# Patient Record
Sex: Male | Born: 1945
Health system: Southern US, Community
[De-identification: ages and names within clinical notes are randomized; demographics above are authoritative.]

## PROBLEM LIST (undated history)

## (undated) DIAGNOSIS — F329 Major depressive disorder, single episode, unspecified: Secondary | ICD-10-CM

## (undated) DIAGNOSIS — E7800 Pure hypercholesterolemia, unspecified: Secondary | ICD-10-CM

## (undated) DIAGNOSIS — F32A Depression, unspecified: Secondary | ICD-10-CM

## (undated) DIAGNOSIS — B019 Varicella without complication: Secondary | ICD-10-CM

## (undated) DIAGNOSIS — G4730 Sleep apnea, unspecified: Secondary | ICD-10-CM

## (undated) DIAGNOSIS — I10 Essential (primary) hypertension: Secondary | ICD-10-CM

## (undated) DIAGNOSIS — F419 Anxiety disorder, unspecified: Secondary | ICD-10-CM

## (undated) HISTORY — DX: Sleep apnea, unspecified: G47.30

## (undated) HISTORY — DX: Pure hypercholesterolemia, unspecified: E78.00

## (undated) HISTORY — DX: Depression, unspecified: F32.A

## (undated) HISTORY — DX: Varicella without complication: B01.9

## (undated) HISTORY — DX: Major depressive disorder, single episode, unspecified: F32.9

## (undated) HISTORY — DX: Anxiety disorder, unspecified: F41.9

## (undated) HISTORY — DX: Essential (primary) hypertension: I10

## (undated) HISTORY — PX: TONSILLECTOMY: SUR1361

## (undated) HISTORY — PX: HERNIA REPAIR: SHX51

---

## 2009-12-26 ENCOUNTER — Ambulatory Visit (HOSPITAL_COMMUNITY): Admission: RE | Admit: 2009-12-26 | Discharge: 2009-12-26 | Payer: Self-pay | Admitting: Urology

## 2012-11-24 DIAGNOSIS — N209 Urinary calculus, unspecified: Secondary | ICD-10-CM | POA: Diagnosis not present

## 2012-11-24 DIAGNOSIS — N200 Calculus of kidney: Secondary | ICD-10-CM | POA: Diagnosis not present

## 2012-11-24 DIAGNOSIS — I10 Essential (primary) hypertension: Secondary | ICD-10-CM | POA: Diagnosis not present

## 2012-11-25 DIAGNOSIS — Z23 Encounter for immunization: Secondary | ICD-10-CM | POA: Diagnosis not present

## 2013-01-04 DIAGNOSIS — R55 Syncope and collapse: Secondary | ICD-10-CM | POA: Diagnosis not present

## 2013-01-07 DIAGNOSIS — R55 Syncope and collapse: Secondary | ICD-10-CM | POA: Diagnosis not present

## 2013-01-07 DIAGNOSIS — R4789 Other speech disturbances: Secondary | ICD-10-CM | POA: Diagnosis not present

## 2013-01-07 DIAGNOSIS — I658 Occlusion and stenosis of other precerebral arteries: Secondary | ICD-10-CM | POA: Diagnosis not present

## 2013-01-18 DIAGNOSIS — R4789 Other speech disturbances: Secondary | ICD-10-CM | POA: Diagnosis not present

## 2013-01-18 DIAGNOSIS — R42 Dizziness and giddiness: Secondary | ICD-10-CM | POA: Diagnosis not present

## 2013-01-18 DIAGNOSIS — H538 Other visual disturbances: Secondary | ICD-10-CM | POA: Diagnosis not present

## 2013-01-18 DIAGNOSIS — R55 Syncope and collapse: Secondary | ICD-10-CM | POA: Diagnosis not present

## 2013-02-05 DIAGNOSIS — R404 Transient alteration of awareness: Secondary | ICD-10-CM | POA: Diagnosis not present

## 2013-02-05 DIAGNOSIS — R5381 Other malaise: Secondary | ICD-10-CM | POA: Diagnosis not present

## 2013-02-19 DIAGNOSIS — J9819 Other pulmonary collapse: Secondary | ICD-10-CM | POA: Diagnosis not present

## 2013-02-23 DIAGNOSIS — I10 Essential (primary) hypertension: Secondary | ICD-10-CM | POA: Diagnosis not present

## 2013-03-03 DIAGNOSIS — I10 Essential (primary) hypertension: Secondary | ICD-10-CM | POA: Diagnosis not present

## 2013-05-28 DIAGNOSIS — J209 Acute bronchitis, unspecified: Secondary | ICD-10-CM | POA: Diagnosis not present

## 2013-05-28 DIAGNOSIS — I10 Essential (primary) hypertension: Secondary | ICD-10-CM | POA: Diagnosis not present

## 2013-07-28 DIAGNOSIS — D239 Other benign neoplasm of skin, unspecified: Secondary | ICD-10-CM | POA: Diagnosis not present

## 2013-07-28 DIAGNOSIS — D485 Neoplasm of uncertain behavior of skin: Secondary | ICD-10-CM | POA: Diagnosis not present

## 2013-07-28 DIAGNOSIS — L570 Actinic keratosis: Secondary | ICD-10-CM | POA: Diagnosis not present

## 2013-07-28 DIAGNOSIS — L821 Other seborrheic keratosis: Secondary | ICD-10-CM | POA: Diagnosis not present

## 2013-07-28 DIAGNOSIS — D044 Carcinoma in situ of skin of scalp and neck: Secondary | ICD-10-CM | POA: Diagnosis not present

## 2013-08-05 DIAGNOSIS — C4442 Squamous cell carcinoma of skin of scalp and neck: Secondary | ICD-10-CM | POA: Diagnosis not present

## 2013-08-30 DIAGNOSIS — I10 Essential (primary) hypertension: Secondary | ICD-10-CM | POA: Diagnosis not present

## 2013-08-30 DIAGNOSIS — Z131 Encounter for screening for diabetes mellitus: Secondary | ICD-10-CM | POA: Diagnosis not present

## 2013-08-30 DIAGNOSIS — N63 Unspecified lump in unspecified breast: Secondary | ICD-10-CM | POA: Diagnosis not present

## 2013-09-07 DIAGNOSIS — N62 Hypertrophy of breast: Secondary | ICD-10-CM | POA: Diagnosis not present

## 2013-11-12 DIAGNOSIS — H2510 Age-related nuclear cataract, unspecified eye: Secondary | ICD-10-CM | POA: Diagnosis not present

## 2013-11-12 DIAGNOSIS — H538 Other visual disturbances: Secondary | ICD-10-CM | POA: Diagnosis not present

## 2013-11-12 DIAGNOSIS — H3540 Unspecified peripheral retinal degeneration: Secondary | ICD-10-CM | POA: Diagnosis not present

## 2013-11-12 DIAGNOSIS — H35369 Drusen (degenerative) of macula, unspecified eye: Secondary | ICD-10-CM | POA: Diagnosis not present

## 2013-11-17 DIAGNOSIS — H3530 Unspecified macular degeneration: Secondary | ICD-10-CM | POA: Diagnosis not present

## 2013-11-17 DIAGNOSIS — H538 Other visual disturbances: Secondary | ICD-10-CM | POA: Diagnosis not present

## 2013-11-17 DIAGNOSIS — H35729 Serous detachment of retinal pigment epithelium, unspecified eye: Secondary | ICD-10-CM | POA: Diagnosis not present

## 2013-11-17 DIAGNOSIS — H2510 Age-related nuclear cataract, unspecified eye: Secondary | ICD-10-CM | POA: Diagnosis not present

## 2013-11-22 DIAGNOSIS — H35369 Drusen (degenerative) of macula, unspecified eye: Secondary | ICD-10-CM | POA: Diagnosis not present

## 2013-11-22 DIAGNOSIS — H3560 Retinal hemorrhage, unspecified eye: Secondary | ICD-10-CM | POA: Diagnosis not present

## 2013-11-22 DIAGNOSIS — H353290 Exudative age-related macular degeneration, unspecified eye, stage unspecified: Secondary | ICD-10-CM | POA: Diagnosis not present

## 2013-11-22 DIAGNOSIS — H2510 Age-related nuclear cataract, unspecified eye: Secondary | ICD-10-CM | POA: Diagnosis not present

## 2013-11-24 DIAGNOSIS — L821 Other seborrheic keratosis: Secondary | ICD-10-CM | POA: Diagnosis not present

## 2013-11-24 DIAGNOSIS — L570 Actinic keratosis: Secondary | ICD-10-CM | POA: Diagnosis not present

## 2013-11-24 DIAGNOSIS — Z85828 Personal history of other malignant neoplasm of skin: Secondary | ICD-10-CM | POA: Diagnosis not present

## 2013-11-24 DIAGNOSIS — D485 Neoplasm of uncertain behavior of skin: Secondary | ICD-10-CM | POA: Diagnosis not present

## 2013-12-03 DIAGNOSIS — Z125 Encounter for screening for malignant neoplasm of prostate: Secondary | ICD-10-CM | POA: Diagnosis not present

## 2013-12-03 DIAGNOSIS — I10 Essential (primary) hypertension: Secondary | ICD-10-CM | POA: Diagnosis not present

## 2013-12-03 DIAGNOSIS — Z131 Encounter for screening for diabetes mellitus: Secondary | ICD-10-CM | POA: Diagnosis not present

## 2013-12-03 DIAGNOSIS — Z0000 Encounter for general adult medical examination without abnormal findings: Secondary | ICD-10-CM | POA: Diagnosis not present

## 2013-12-27 DIAGNOSIS — H35059 Retinal neovascularization, unspecified, unspecified eye: Secondary | ICD-10-CM | POA: Diagnosis not present

## 2013-12-27 DIAGNOSIS — H353290 Exudative age-related macular degeneration, unspecified eye, stage unspecified: Secondary | ICD-10-CM | POA: Diagnosis not present

## 2014-02-02 DIAGNOSIS — H35059 Retinal neovascularization, unspecified, unspecified eye: Secondary | ICD-10-CM | POA: Diagnosis not present

## 2014-02-02 DIAGNOSIS — H353290 Exudative age-related macular degeneration, unspecified eye, stage unspecified: Secondary | ICD-10-CM | POA: Diagnosis not present

## 2014-02-14 DIAGNOSIS — F489 Nonpsychotic mental disorder, unspecified: Secondary | ICD-10-CM | POA: Diagnosis not present

## 2014-02-14 DIAGNOSIS — F29 Unspecified psychosis not due to a substance or known physiological condition: Secondary | ICD-10-CM | POA: Diagnosis not present

## 2014-03-21 DIAGNOSIS — L570 Actinic keratosis: Secondary | ICD-10-CM | POA: Diagnosis not present

## 2014-03-21 DIAGNOSIS — Z85828 Personal history of other malignant neoplasm of skin: Secondary | ICD-10-CM | POA: Diagnosis not present

## 2014-03-30 DIAGNOSIS — H353290 Exudative age-related macular degeneration, unspecified eye, stage unspecified: Secondary | ICD-10-CM | POA: Diagnosis not present

## 2014-03-30 DIAGNOSIS — H35059 Retinal neovascularization, unspecified, unspecified eye: Secondary | ICD-10-CM | POA: Diagnosis not present

## 2014-04-26 DIAGNOSIS — M79609 Pain in unspecified limb: Secondary | ICD-10-CM | POA: Diagnosis not present

## 2014-04-26 DIAGNOSIS — M25579 Pain in unspecified ankle and joints of unspecified foot: Secondary | ICD-10-CM | POA: Diagnosis not present

## 2014-05-03 DIAGNOSIS — H353290 Exudative age-related macular degeneration, unspecified eye, stage unspecified: Secondary | ICD-10-CM | POA: Diagnosis not present

## 2014-05-03 DIAGNOSIS — H35059 Retinal neovascularization, unspecified, unspecified eye: Secondary | ICD-10-CM | POA: Diagnosis not present

## 2014-05-17 DIAGNOSIS — M79609 Pain in unspecified limb: Secondary | ICD-10-CM | POA: Diagnosis not present

## 2014-05-17 DIAGNOSIS — M722 Plantar fascial fibromatosis: Secondary | ICD-10-CM | POA: Diagnosis not present

## 2014-05-17 DIAGNOSIS — M25579 Pain in unspecified ankle and joints of unspecified foot: Secondary | ICD-10-CM | POA: Diagnosis not present

## 2014-05-31 DIAGNOSIS — I10 Essential (primary) hypertension: Secondary | ICD-10-CM | POA: Diagnosis not present

## 2014-05-31 DIAGNOSIS — Z131 Encounter for screening for diabetes mellitus: Secondary | ICD-10-CM | POA: Diagnosis not present

## 2014-06-14 DIAGNOSIS — H353290 Exudative age-related macular degeneration, unspecified eye, stage unspecified: Secondary | ICD-10-CM | POA: Diagnosis not present

## 2014-06-14 DIAGNOSIS — H35059 Retinal neovascularization, unspecified, unspecified eye: Secondary | ICD-10-CM | POA: Diagnosis not present

## 2014-06-27 DIAGNOSIS — M79672 Pain in left foot: Secondary | ICD-10-CM | POA: Diagnosis not present

## 2014-06-27 DIAGNOSIS — M7662 Achilles tendinitis, left leg: Secondary | ICD-10-CM | POA: Diagnosis not present

## 2014-07-19 DIAGNOSIS — H3532 Exudative age-related macular degeneration: Secondary | ICD-10-CM | POA: Diagnosis not present

## 2014-07-19 DIAGNOSIS — H35052 Retinal neovascularization, unspecified, left eye: Secondary | ICD-10-CM | POA: Diagnosis not present

## 2014-08-23 DIAGNOSIS — H3532 Exudative age-related macular degeneration: Secondary | ICD-10-CM | POA: Diagnosis not present

## 2014-08-23 DIAGNOSIS — H35052 Retinal neovascularization, unspecified, left eye: Secondary | ICD-10-CM | POA: Diagnosis not present

## 2014-08-29 DIAGNOSIS — L570 Actinic keratosis: Secondary | ICD-10-CM | POA: Diagnosis not present

## 2014-08-29 DIAGNOSIS — Z85828 Personal history of other malignant neoplasm of skin: Secondary | ICD-10-CM | POA: Diagnosis not present

## 2014-08-30 DIAGNOSIS — I10 Essential (primary) hypertension: Secondary | ICD-10-CM | POA: Diagnosis not present

## 2014-08-30 DIAGNOSIS — E782 Mixed hyperlipidemia: Secondary | ICD-10-CM | POA: Diagnosis not present

## 2014-08-30 DIAGNOSIS — N182 Chronic kidney disease, stage 2 (mild): Secondary | ICD-10-CM | POA: Diagnosis not present

## 2014-08-30 DIAGNOSIS — Z131 Encounter for screening for diabetes mellitus: Secondary | ICD-10-CM | POA: Diagnosis not present

## 2014-09-06 DIAGNOSIS — M722 Plantar fascial fibromatosis: Secondary | ICD-10-CM | POA: Diagnosis not present

## 2014-09-06 DIAGNOSIS — M79672 Pain in left foot: Secondary | ICD-10-CM | POA: Diagnosis not present

## 2014-09-27 DIAGNOSIS — H3532 Exudative age-related macular degeneration: Secondary | ICD-10-CM | POA: Diagnosis not present

## 2014-09-27 DIAGNOSIS — H35052 Retinal neovascularization, unspecified, left eye: Secondary | ICD-10-CM | POA: Diagnosis not present

## 2014-10-04 DIAGNOSIS — M722 Plantar fascial fibromatosis: Secondary | ICD-10-CM | POA: Diagnosis not present

## 2014-10-04 DIAGNOSIS — M79672 Pain in left foot: Secondary | ICD-10-CM | POA: Diagnosis not present

## 2014-11-01 DIAGNOSIS — H35052 Retinal neovascularization, unspecified, left eye: Secondary | ICD-10-CM | POA: Diagnosis not present

## 2014-11-01 DIAGNOSIS — H3532 Exudative age-related macular degeneration: Secondary | ICD-10-CM | POA: Diagnosis not present

## 2014-12-01 DIAGNOSIS — R5382 Chronic fatigue, unspecified: Secondary | ICD-10-CM | POA: Diagnosis not present

## 2014-12-01 DIAGNOSIS — I10 Essential (primary) hypertension: Secondary | ICD-10-CM | POA: Diagnosis not present

## 2014-12-01 DIAGNOSIS — N182 Chronic kidney disease, stage 2 (mild): Secondary | ICD-10-CM | POA: Diagnosis not present

## 2014-12-01 DIAGNOSIS — E782 Mixed hyperlipidemia: Secondary | ICD-10-CM | POA: Diagnosis not present

## 2014-12-01 DIAGNOSIS — R5383 Other fatigue: Secondary | ICD-10-CM | POA: Diagnosis not present

## 2014-12-15 DIAGNOSIS — H3532 Exudative age-related macular degeneration: Secondary | ICD-10-CM | POA: Diagnosis not present

## 2014-12-29 DIAGNOSIS — M722 Plantar fascial fibromatosis: Secondary | ICD-10-CM | POA: Diagnosis not present

## 2015-01-04 DIAGNOSIS — M79672 Pain in left foot: Secondary | ICD-10-CM | POA: Diagnosis not present

## 2015-01-04 DIAGNOSIS — M65872 Other synovitis and tenosynovitis, left ankle and foot: Secondary | ICD-10-CM | POA: Diagnosis not present

## 2015-01-04 DIAGNOSIS — M722 Plantar fascial fibromatosis: Secondary | ICD-10-CM | POA: Diagnosis not present

## 2015-01-04 DIAGNOSIS — M79671 Pain in right foot: Secondary | ICD-10-CM | POA: Diagnosis not present

## 2015-01-05 DIAGNOSIS — M722 Plantar fascial fibromatosis: Secondary | ICD-10-CM | POA: Diagnosis not present

## 2015-01-19 DIAGNOSIS — H3532 Exudative age-related macular degeneration: Secondary | ICD-10-CM | POA: Diagnosis not present

## 2015-02-16 DIAGNOSIS — M722 Plantar fascial fibromatosis: Secondary | ICD-10-CM | POA: Diagnosis not present

## 2015-02-23 DIAGNOSIS — H3532 Exudative age-related macular degeneration: Secondary | ICD-10-CM | POA: Diagnosis not present

## 2015-02-23 DIAGNOSIS — H35052 Retinal neovascularization, unspecified, left eye: Secondary | ICD-10-CM | POA: Diagnosis not present

## 2015-02-27 DIAGNOSIS — B360 Pityriasis versicolor: Secondary | ICD-10-CM | POA: Diagnosis not present

## 2015-02-27 DIAGNOSIS — Z85828 Personal history of other malignant neoplasm of skin: Secondary | ICD-10-CM | POA: Diagnosis not present

## 2015-02-27 DIAGNOSIS — L570 Actinic keratosis: Secondary | ICD-10-CM | POA: Diagnosis not present

## 2015-03-02 DIAGNOSIS — M722 Plantar fascial fibromatosis: Secondary | ICD-10-CM | POA: Diagnosis not present

## 2015-03-02 DIAGNOSIS — M25572 Pain in left ankle and joints of left foot: Secondary | ICD-10-CM | POA: Diagnosis not present

## 2015-03-06 DIAGNOSIS — E782 Mixed hyperlipidemia: Secondary | ICD-10-CM | POA: Diagnosis not present

## 2015-03-06 DIAGNOSIS — Z1389 Encounter for screening for other disorder: Secondary | ICD-10-CM | POA: Diagnosis not present

## 2015-03-06 DIAGNOSIS — Z0000 Encounter for general adult medical examination without abnormal findings: Secondary | ICD-10-CM | POA: Diagnosis not present

## 2015-03-06 DIAGNOSIS — I10 Essential (primary) hypertension: Secondary | ICD-10-CM | POA: Diagnosis not present

## 2015-03-06 DIAGNOSIS — R5382 Chronic fatigue, unspecified: Secondary | ICD-10-CM | POA: Diagnosis not present

## 2015-03-06 DIAGNOSIS — Z125 Encounter for screening for malignant neoplasm of prostate: Secondary | ICD-10-CM | POA: Diagnosis not present

## 2015-04-06 DIAGNOSIS — H3532 Exudative age-related macular degeneration: Secondary | ICD-10-CM | POA: Diagnosis not present

## 2015-05-03 ENCOUNTER — Ambulatory Visit (HOSPITAL_COMMUNITY): Payer: Self-pay | Admitting: Psychiatry

## 2015-05-08 ENCOUNTER — Ambulatory Visit (HOSPITAL_COMMUNITY): Payer: Self-pay | Admitting: Psychiatry

## 2015-05-11 DIAGNOSIS — H3532 Exudative age-related macular degeneration: Secondary | ICD-10-CM | POA: Diagnosis not present

## 2015-05-12 ENCOUNTER — Telehealth (HOSPITAL_COMMUNITY): Payer: Self-pay | Admitting: *Deleted

## 2015-05-16 ENCOUNTER — Ambulatory Visit (INDEPENDENT_AMBULATORY_CARE_PROVIDER_SITE_OTHER): Payer: Medicare Other | Admitting: Psychology

## 2015-05-16 DIAGNOSIS — F331 Major depressive disorder, recurrent, moderate: Secondary | ICD-10-CM | POA: Diagnosis not present

## 2015-05-16 DIAGNOSIS — F1010 Alcohol abuse, uncomplicated: Secondary | ICD-10-CM

## 2015-05-17 ENCOUNTER — Encounter (HOSPITAL_COMMUNITY): Payer: Self-pay | Admitting: Psychology

## 2015-05-17 NOTE — Progress Notes (Signed)
Patient:  Marvin White   DOB: July 16, 1946  MR Number: [id number]  Location: [facility] [address] [place] [address] Dept: [phone]  Start: 3 PM End: 4 PM  Provider/Observer:     Edgardo Roys PSYD  Chief Complaint:      Chief Complaint  Patient presents with  . Alcohol Problem  . Depression    Reason For Service:     The patient was referred because of her relapse related to his long-standing alcoholism. Patient is a 69 year old Caucasian male. The patient reports that his last serious relapse was at the end of July of this year. The patient reports that over weekend he drank a half a gallon of alcohol. He has not had any alcohol since that time. He does have a history of significant alcohol events and in years past was an everyday drinker. However, he is not doing the everyday drinking. The patient reports that he would like to remain free of alcohol.  The patient also reports that he has a long history of recurrent depressive symptoms. The patient describes moderate significant symptoms of depression, anxiety, mood disturbance, appetite changes, irritability and agitation, panic events, hyperactivity, racing thoughts, and some excessive worrying. He also has times where he describes as auditory hallucinations but this is not frequent.  Interventions Strategy:  Cognitive/behavioral psychotherapeutic interventions.  Participation Level:   Active  Participation Quality:  Appropriate      Behavioral Observation:  Well Groomed, Alert, and Appropriate.   Current Psychosocial Factors: The patient reports that his wife is strongly against any alcohol abuse. The patient reports that she is supportive of him remaining free of alcohol but there are times when he relapses. He has gone to [employer] but does not feel like it is very appropriate her helpful thing for him.  Content of  Session:   Reviewed current symptoms and work on therapeutic interventions are in issues related to his recurrent depression and anxiety as well as his recurrent alcohol events.  Current Status:   The patient reports that he has been experiencing significant depression anxiety and that he recently started Celexa which he feels was and has been very helpful for him.  Patient Progress:   Stable  Target Goals:   Target goals include reducing intensity, severity, duration of depressive and a anxiety events as well as maintaining sobriety.  Last Reviewed:   05/16/2015  Goals Addressed Today:    Goals addressed today had to do with strategies for ongoing sobriety as well as developing skills to better manage his depression and anxiety.  Impression/Diagnosis:   The patient describes a long-standing history of underlying anxiety and depression/agitation. The patient reports that his alcohol abuse has correlated with changes in his mood and appetite. Patient reports that he knows that he uses alcohol to try to self medicate negative emotional states.  Diagnosis:    Axis I: Alcohol abuse  Major depressive disorder, recurrent episode, moderate

## 2015-05-26 ENCOUNTER — Telehealth (HOSPITAL_COMMUNITY): Payer: Self-pay | Admitting: *Deleted

## 2015-05-31 ENCOUNTER — Ambulatory Visit (INDEPENDENT_AMBULATORY_CARE_PROVIDER_SITE_OTHER): Payer: Medicare Other | Admitting: Psychology

## 2015-05-31 DIAGNOSIS — F331 Major depressive disorder, recurrent, moderate: Secondary | ICD-10-CM

## 2015-06-01 ENCOUNTER — Ambulatory Visit (HOSPITAL_COMMUNITY): Payer: Self-pay | Admitting: Psychology

## 2015-06-01 ENCOUNTER — Encounter (HOSPITAL_COMMUNITY): Payer: Self-pay | Admitting: Psychology

## 2015-06-01 NOTE — Progress Notes (Signed)
Patient:  Marvin White   DOB: 10/02/1945  MR Number: [id number]  Location: [facility] [address] [place] [address] Dept: [phone]  Start: 4 PM End: 5 PM  Provider/Observer:     Edgardo Roys PSYD  Chief Complaint:      Chief Complaint  Patient presents with  . Depression    Reason For Service:     The patient was referred because of her relapse related to his long-standing alcoholism. Patient is a 69 year old Caucasian male. The patient reports that his last serious relapse was at the end of July of this year. The patient reports that over weekend he drank a half a gallon of alcohol. He has not had any alcohol since that time. He does have a history of significant alcohol events and in years past was an everyday drinker. However, he is not doing the everyday drinking. The patient reports that he would like to remain free of alcohol.  The patient also reports that he has a long history of recurrent depressive symptoms. The patient describes moderate significant symptoms of depression, anxiety, mood disturbance, appetite changes, irritability and agitation, panic events, hyperactivity, racing thoughts, and some excessive worrying. He also has times where he describes as auditory hallucinations but this is not frequent.  Interventions Strategy:  Cognitive/behavioral psychotherapeutic interventions.  Participation Level:   Active  Participation Quality:  Appropriate      Behavioral Observation:  Well Groomed, Alert, and Appropriate.   Current Psychosocial Factors: The patient reports that he has been doing better with depression as we works on [employer], therapeutic.  He has not used any ETOH since July.  THe patient reports that this has helped with relationship with wife.  Content of Session:   Reviewed current symptoms and work on therapeutic interventions are in issues related to his  recurrent depression and anxiety as well as his recurrent alcohol events.  Current Status:   The patient reports that he has been experiencing significant depression anxiety and that he recently started Celexa which he feels was and has been very helpful for him.  Patient Progress:   Stable  Target Goals:   Target goals include reducing intensity, severity, duration of depressive and a anxiety events as well as maintaining sobriety.  Last Reviewed:   05/31/2015  Goals Addressed Today:    Goals addressed today had to do with strategies for ongoing sobriety as well as developing skills to better manage his depression and anxiety.  Impression/Diagnosis:   The patient describes a long-standing history of underlying anxiety and depression/agitation. The patient reports that his alcohol abuse has correlated with changes in his mood and appetite. Patient reports that he knows that he uses alcohol to try to self medicate negative emotional states.  Diagnosis:    Axis I: Major depressive disorder, recurrent episode, moderate

## 2015-06-15 DIAGNOSIS — H3532 Exudative age-related macular degeneration: Secondary | ICD-10-CM | POA: Diagnosis not present

## 2015-06-19 DIAGNOSIS — K210 Gastro-esophageal reflux disease with esophagitis: Secondary | ICD-10-CM | POA: Diagnosis not present

## 2015-06-19 DIAGNOSIS — I10 Essential (primary) hypertension: Secondary | ICD-10-CM | POA: Diagnosis not present

## 2015-06-23 ENCOUNTER — Ambulatory Visit (HOSPITAL_COMMUNITY): Payer: Self-pay | Admitting: Psychology

## 2015-06-26 ENCOUNTER — Ambulatory Visit (INDEPENDENT_AMBULATORY_CARE_PROVIDER_SITE_OTHER): Payer: Medicare Other | Admitting: Psychology

## 2015-06-26 DIAGNOSIS — S098XXA Other specified injuries of head, initial encounter: Secondary | ICD-10-CM | POA: Diagnosis not present

## 2015-06-26 DIAGNOSIS — F331 Major depressive disorder, recurrent, moderate: Secondary | ICD-10-CM

## 2015-06-26 DIAGNOSIS — S61214A Laceration without foreign body of right ring finger without damage to nail, initial encounter: Secondary | ICD-10-CM | POA: Diagnosis not present

## 2015-06-26 DIAGNOSIS — S199XXA Unspecified injury of neck, initial encounter: Secondary | ICD-10-CM | POA: Diagnosis not present

## 2015-06-26 DIAGNOSIS — S0181XA Laceration without foreign body of other part of head, initial encounter: Secondary | ICD-10-CM | POA: Diagnosis not present

## 2015-06-26 DIAGNOSIS — F1010 Alcohol abuse, uncomplicated: Secondary | ICD-10-CM

## 2015-06-26 DIAGNOSIS — W1839XA Other fall on same level, initial encounter: Secondary | ICD-10-CM | POA: Diagnosis not present

## 2015-06-26 DIAGNOSIS — S01111A Laceration without foreign body of right eyelid and periocular area, initial encounter: Secondary | ICD-10-CM | POA: Diagnosis not present

## 2015-06-26 DIAGNOSIS — S0093XA Contusion of unspecified part of head, initial encounter: Secondary | ICD-10-CM | POA: Diagnosis not present

## 2015-07-03 ENCOUNTER — Telehealth (HOSPITAL_COMMUNITY): Payer: Self-pay | Admitting: *Deleted

## 2015-07-03 ENCOUNTER — Encounter (HOSPITAL_COMMUNITY): Payer: Self-pay | Admitting: Psychology
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
## 2015-07-11 ENCOUNTER — Ambulatory Visit (INDEPENDENT_AMBULATORY_CARE_PROVIDER_SITE_OTHER): Payer: Medicare Other | Admitting: Psychology

## 2015-07-11 DIAGNOSIS — F331 Major depressive disorder, recurrent, moderate: Secondary | ICD-10-CM

## 2015-07-11 DIAGNOSIS — F1010 Alcohol abuse, uncomplicated: Secondary | ICD-10-CM | POA: Diagnosis not present

## 2015-07-12 ENCOUNTER — Encounter (HOSPITAL_COMMUNITY): Payer: Self-pay | Admitting: Psychology

## 2015-07-12 NOTE — Progress Notes (Signed)
Patient:  Marvin White   DOB: [birth year]  MR Number: [id number]  Location: [facility] [address] [place] [address] Dept: [phone]  Start: 2 PM End: 3 PM  Provider/Observer:     Edgardo Roys PSYD  Chief Complaint:      Chief Complaint  Patient presents with  . Agitation  . Stress  . Alcohol Problem    Reason For Service:     The patient was referred because of her relapse related to his long-standing alcoholism. Patient is a 69 year old Caucasian male. The patient reports that his last serious relapse was at the end of July of this year. The patient reports that over weekend he drank a half a gallon of alcohol. He has not had any alcohol since that time. He does have a history of significant alcohol events and in years past was an everyday drinker. However, he is not doing the everyday drinking. The patient reports that he would like to remain free of alcohol.  The patient also reports that he has a long history of recurrent depressive symptoms. The patient describes moderate significant symptoms of depression, anxiety, mood disturbance, appetite changes, irritability and agitation, panic events, hyperactivity, racing thoughts, and some excessive worrying. He also has times where he describes as auditory hallucinations but this is not frequent.  Interventions Strategy:  Cognitive/behavioral psychotherapeutic interventions.  Participation Level:   Active  Participation Quality:  Appropriate      Behavioral Observation:  Well Groomed, Alert, and Appropriate.   Current Psychosocial Factors: The patient comes in today after his hospitalization. The patient reports that the day after we met the last time that he began drinking again quite heavily. He reports that he ended up having a severe fall and landing on his face. He received stitches in his face. He was hospitalized at this time  for the physical injuries and discharge. He has been working with his primary care physician as well. The patient reports that he has not drank since this incident and we began working on strategies to help facilitate this ongoing. The patient reports that he accepts that he is an alcoholic and knows that if he does not change these situations that it will be fatal to him.  Content of Session:   Reviewed current symptoms and work on therapeutic interventions are in issues related to his recurrent depression and anxiety as well as his recurrent alcohol events.  Current Status:   The patient reports that he has not had any alcohol since his last binge episode. He reports that he had a fall where he went face first and received stitches to his face. He was hospitalized with a blood alcohol level over 0.3.  Patient Progress:   Stable  Target Goals:   Target goals include reducing intensity, severity, duration of depressive and a anxiety events as well as maintaining sobriety.  Last Reviewed:   07/11/2015  Goals Addressed Today:    Goals addressed today had to do with strategies for ongoing sobriety as well as developing skills to better manage his depression and anxiety.  Impression/Diagnosis:   The patient describes a long-standing history of underlying anxiety and depression/agitation. The patient reports that his alcohol abuse has correlated with changes in his mood and appetite. Patient reports that he knows that he uses alcohol to try to self medicate negative emotional states.  Diagnosis:    Axis I: Major depressive disorder, recurrent episode, moderate ([place])  Alcohol abuse

## 2015-07-17 ENCOUNTER — Ambulatory Visit (INDEPENDENT_AMBULATORY_CARE_PROVIDER_SITE_OTHER): Payer: Medicare Other | Admitting: Psychology

## 2015-07-17 ENCOUNTER — Ambulatory Visit (HOSPITAL_COMMUNITY): Payer: Self-pay | Admitting: Psychology

## 2015-07-17 DIAGNOSIS — F331 Major depressive disorder, recurrent, moderate: Secondary | ICD-10-CM

## 2015-07-17 DIAGNOSIS — F1010 Alcohol abuse, uncomplicated: Secondary | ICD-10-CM | POA: Diagnosis not present

## 2015-07-20 DIAGNOSIS — H353131 Nonexudative age-related macular degeneration, bilateral, early dry stage: Secondary | ICD-10-CM | POA: Diagnosis not present

## 2015-07-20 DIAGNOSIS — H353221 Exudative age-related macular degeneration, left eye, with active choroidal neovascularization: Secondary | ICD-10-CM | POA: Diagnosis not present

## 2015-07-21 ENCOUNTER — Encounter (HOSPITAL_COMMUNITY): Payer: Self-pay | Admitting: Psychology

## 2015-07-21 NOTE — Progress Notes (Signed)
Patient:  Marvin White   DOB: 1946-03-13  MR Number: [id number]  Location: [facility] [address] [place] [address] Dept: [phone]  Start: 2 PM End: 3 PM  Provider/Observer:     Edgardo Roys PSYD  Chief Complaint:      Chief Complaint  Patient presents with  . Anxiety  . Agitation  . Depression  . Alcohol Problem    Reason For Service:     The patient was referred because of her relapse related to his long-standing alcoholism. Patient is a 69 year old Caucasian male. The patient reports that his last serious relapse was at the end of July of this year. The patient reports that over weekend he drank a half a gallon of alcohol. He has not had any alcohol since that time. He does have a history of significant alcohol events and in years past was an everyday drinker. However, he is not doing the everyday drinking. The patient reports that he would like to remain free of alcohol.  The patient also reports that he has a long history of recurrent depressive symptoms. The patient describes moderate significant symptoms of depression, anxiety, mood disturbance, appetite changes, irritability and agitation, panic events, hyperactivity, racing thoughts, and some excessive worrying. He also has times where he describes as auditory hallucinations but this is not frequent.  Interventions Strategy:  Cognitive/behavioral psychotherapeutic interventions.  Participation Level:   Active  Participation Quality:  Appropriate      Behavioral Observation:  Well Groomed, Alert, and Appropriate.   Current Psychosocial Factors: The patient comes in to keep you informed about how he is doing. and reports that he has had no alcohol since our last visit. The patient reports he has had times where he has had cravings but utilized the support network we develop during her last visit. The patient is to call his  priest  Content of Session:   Reviewed current symptoms and work on therapeutic interventions are in issues related to his recurrent depression and anxiety as well as his recurrent alcohol events.  Current Status:   The patient reports that he has not had any alcohol since his last binge episode. He reports that he had a fall where he went face first and received stitches to his face. He was hospitalized with a blood alcohol level over 0.3.  Patient Progress:   Stable  Target Goals:   Target goals include reducing intensity, severity, duration of depressive and a anxiety events as well as maintaining sobriety.  Last Reviewed:   07/17/2015  Goals Addressed Today:    Goals addressed today had to do with strategies for ongoing sobriety as well as developing skills to better manage his depression and anxiety.  Impression/Diagnosis:   The patient describes a long-standing history of underlying anxiety and depression/agitation. The patient reports that his alcohol abuse has correlated with changes in his mood and appetite. Patient reports that he knows that he uses alcohol to try to self medicate negative emotional states.  Diagnosis:    Axis I: Major depressive disorder, recurrent episode, moderate (HCC)  Alcohol abuse

## 2015-07-31 ENCOUNTER — Telehealth (HOSPITAL_COMMUNITY): Payer: Self-pay | Admitting: *Deleted

## 2015-08-03 ENCOUNTER — Ambulatory Visit (INDEPENDENT_AMBULATORY_CARE_PROVIDER_SITE_OTHER): Payer: Medicare Other | Admitting: Psychology

## 2015-08-03 ENCOUNTER — Ambulatory Visit (HOSPITAL_COMMUNITY): Payer: Self-pay | Admitting: Psychology

## 2015-08-03 DIAGNOSIS — F331 Major depressive disorder, recurrent, moderate: Secondary | ICD-10-CM | POA: Diagnosis not present

## 2015-08-03 DIAGNOSIS — F1010 Alcohol abuse, uncomplicated: Secondary | ICD-10-CM

## 2015-08-04 ENCOUNTER — Encounter (HOSPITAL_COMMUNITY): Payer: Self-pay | Admitting: Psychology

## 2015-08-04 NOTE — Progress Notes (Signed)
Patient:  Marvin White   DOB: [birth year]  MR Number: [id number]  Location: [facility] [address] [place] [address] Dept: [phone]  Start: 2 PM End: 3 PM  Provider/Observer:     Edgardo Roys PSYD  Chief Complaint:      Chief Complaint  Patient presents with  . Agitation  . Depression  . Anxiety    Reason For Service:     The patient was referred because of her relapse related to his long-standing alcoholism. Patient is a 69 year old Caucasian male. The patient reports that his last serious relapse was at the end of July of this year. The patient reports that over weekend he drank a half a gallon of alcohol. He has not had any alcohol since that time. He does have a history of significant alcohol events and in years past was an everyday drinker. However, he is not doing the everyday drinking. The patient reports that he would like to remain free of alcohol.  The patient also reports that he has a long history of recurrent depressive symptoms. The patient describes moderate significant symptoms of depression, anxiety, mood disturbance, appetite changes, irritability and agitation, panic events, hyperactivity, racing thoughts, and some excessive worrying. He also has times where he describes as auditory hallucinations but this is not frequent.  Interventions Strategy:  Cognitive/behavioral psychotherapeutic interventions.  Participation Level:   Active  Participation Quality:  Appropriate      Behavioral Observation:  Well Groomed, Alert, and Appropriate.   Current Psychosocial Factors: The patient comes in and reports that he is remain free from alcohol since her last visit. The patient also spent the weekend with his wife at a marriage building retreat and reports that he feels like he was very helpful for them as a couple. He reports that he was able to remain completely free and  away from any alcohol and denies any cravings at this point.  Content of Session:   Reviewed current symptoms and work on therapeutic interventions are in issues related to his recurrent depression and anxiety as well as his recurrent alcohol events.  Current Status:   The patient reports that he has continued to be alcohol free and reports that he is actively going to [employer] and working on coping skills and strategies we have develop.  Patient Progress:   Stable  Target Goals:   Target goals include reducing intensity, severity, duration of depressive and a anxiety events as well as maintaining sobriety.  Last Reviewed:   08/03/2015  Goals Addressed Today:    Goals addressed today had to do with strategies for ongoing sobriety as well as developing skills to better manage his depression and anxiety.  Impression/Diagnosis:   The patient describes a long-standing history of underlying anxiety and depression/agitation. The patient reports that his alcohol abuse has correlated with changes in his mood and appetite. Patient reports that he knows that he uses alcohol to try to self medicate negative emotional states.  Diagnosis:    Axis I: Major depressive disorder, recurrent episode, moderate (HCC)  Alcohol abuse

## 2015-08-23 DIAGNOSIS — H2513 Age-related nuclear cataract, bilateral: Secondary | ICD-10-CM | POA: Diagnosis not present

## 2015-08-24 DIAGNOSIS — H353131 Nonexudative age-related macular degeneration, bilateral, early dry stage: Secondary | ICD-10-CM | POA: Diagnosis not present

## 2015-08-24 DIAGNOSIS — H353221 Exudative age-related macular degeneration, left eye, with active choroidal neovascularization: Secondary | ICD-10-CM | POA: Diagnosis not present

## 2015-09-05 ENCOUNTER — Ambulatory Visit (INDEPENDENT_AMBULATORY_CARE_PROVIDER_SITE_OTHER): Payer: Medicare Other | Admitting: Psychology

## 2015-09-05 DIAGNOSIS — F331 Major depressive disorder, recurrent, moderate: Secondary | ICD-10-CM | POA: Diagnosis not present

## 2015-09-05 DIAGNOSIS — F1010 Alcohol abuse, uncomplicated: Secondary | ICD-10-CM

## 2015-09-12 ENCOUNTER — Encounter (HOSPITAL_COMMUNITY): Payer: Self-pay | Admitting: Psychology

## 2015-09-12 NOTE — Progress Notes (Signed)
Patient:  Marvin White   DOB: 02-12-1946  MR Number: [id number]  Location: [facility] [address] [place] [address] Dept: [phone]  Start: 2 PM End: 3 PM  Provider/Observer:     Edgardo Roys PSYD  Chief Complaint:      Chief Complaint  Patient presents with  . Alcohol Problem  . Agitation  . Anxiety    Reason For Service:     The patient was referred because of her relapse related to his long-standing alcoholism. Patient is a 69 year old Caucasian male. The patient reports that his last serious relapse was at the end of July of this year. The patient reports that over weekend he drank a half a gallon of alcohol. He has not had any alcohol since that time. He does have a history of significant alcohol events and in years past was an everyday drinker. However, he is not doing the everyday drinking. The patient reports that he would like to remain free of alcohol.  The patient also reports that he has a long history of recurrent depressive symptoms. The patient describes moderate significant symptoms of depression, anxiety, mood disturbance, appetite changes, irritability and agitation, panic events, hyperactivity, racing thoughts, and some excessive worrying. He also has times where he describes as auditory hallucinations but this is not frequent.  Interventions Strategy:  Cognitive/behavioral psychotherapeutic interventions.  Participation Level:   Active  Participation Quality:  Appropriate      Behavioral Observation:  Well Groomed, Alert, and Appropriate.   Current Psychosocial Factors: The patient comes in and reports that he is remain free from alcohol since her last visit. The patient also spent the weekend with his wife at a marriage building retreat and reports that he feels like he was very helpful for them as a couple. He reports that he was able to remain completely free  and away from any alcohol and denies any cravings at this point.  Content of Session:   Reviewed current symptoms and work on therapeutic interventions are in issues related to his recurrent depression and anxiety as well as his recurrent alcohol events.  Current Status:   The patient reports that he has continued to be alcohol free and reports that he is actively going to [employer] and working on coping skills and strategies we have develop.  Patient Progress:   Stable  Target Goals:   Target goals include reducing intensity, severity, duration of depressive and a anxiety events as well as maintaining sobriety.  Last Reviewed:    09/05/2015  Goals Addressed Today:    Goals addressed today had to do with strategies for ongoing sobriety as well as developing skills to better manage his depression and anxiety.  Impression/Diagnosis:   The patient describes a long-standing history of underlying anxiety and depression/agitation. The patient reports that his alcohol abuse has correlated with changes in his mood and appetite. Patient reports that he knows that he uses alcohol to try to self medicate negative emotional states.  Diagnosis:    Axis I: Major depressive disorder, recurrent episode, moderate (HCC)  Alcohol abuse

## 2015-09-19 DIAGNOSIS — I10 Essential (primary) hypertension: Secondary | ICD-10-CM | POA: Diagnosis not present

## 2015-09-19 DIAGNOSIS — K210 Gastro-esophageal reflux disease with esophagitis: Secondary | ICD-10-CM | POA: Diagnosis not present

## 2015-09-20 ENCOUNTER — Ambulatory Visit (INDEPENDENT_AMBULATORY_CARE_PROVIDER_SITE_OTHER): Payer: Medicare Other | Admitting: Psychology

## 2015-09-20 DIAGNOSIS — F331 Major depressive disorder, recurrent, moderate: Secondary | ICD-10-CM

## 2015-09-20 DIAGNOSIS — F1010 Alcohol abuse, uncomplicated: Secondary | ICD-10-CM | POA: Diagnosis not present

## 2015-09-29 DIAGNOSIS — H353131 Nonexudative age-related macular degeneration, bilateral, early dry stage: Secondary | ICD-10-CM | POA: Diagnosis not present

## 2015-09-29 DIAGNOSIS — H43812 Vitreous degeneration, left eye: Secondary | ICD-10-CM | POA: Diagnosis not present

## 2015-09-29 DIAGNOSIS — H43822 Vitreomacular adhesion, left eye: Secondary | ICD-10-CM | POA: Diagnosis not present

## 2015-09-29 DIAGNOSIS — H2512 Age-related nuclear cataract, left eye: Secondary | ICD-10-CM | POA: Diagnosis not present

## 2015-09-29 DIAGNOSIS — H353221 Exudative age-related macular degeneration, left eye, with active choroidal neovascularization: Secondary | ICD-10-CM | POA: Diagnosis not present

## 2015-10-18 ENCOUNTER — Ambulatory Visit (INDEPENDENT_AMBULATORY_CARE_PROVIDER_SITE_OTHER): Payer: Medicare Other | Admitting: Psychology

## 2015-10-18 DIAGNOSIS — F331 Major depressive disorder, recurrent, moderate: Secondary | ICD-10-CM | POA: Diagnosis not present

## 2015-10-18 DIAGNOSIS — F1010 Alcohol abuse, uncomplicated: Secondary | ICD-10-CM

## 2015-10-19 ENCOUNTER — Encounter (HOSPITAL_COMMUNITY): Payer: Self-pay | Admitting: Psychology

## 2015-10-19 NOTE — Progress Notes (Signed)
Patient:  Marvin White   DOB: 20-Mar-1946  MR Number: [id number]  Location: [facility] [address] [place] [address] Dept: [phone]  Start: 2 PM End: 3 PM  Provider/Observer:     Edgardo Roys PSYD  Chief Complaint:      Chief Complaint  Patient presents with  . Stress    Reason For Service:     The patient was referred because of her relapse related to his long-standing alcoholism. Patient is a 70 year old Caucasian male. The patient reports that his last serious relapse was at the end of July of this year. The patient reports that over weekend he drank a half a gallon of alcohol. He has not had any alcohol since that time. He does have a history of significant alcohol events and in years past was an everyday drinker. However, he is not doing the everyday drinking. The patient reports that he would like to remain free of alcohol.  The patient also reports that he has a long history of recurrent depressive symptoms. The patient describes moderate significant symptoms of depression, anxiety, mood disturbance, appetite changes, irritability and agitation, panic events, hyperactivity, racing thoughts, and some excessive worrying. He also has times where he describes as auditory hallucinations but this is not frequent.  Interventions Strategy:  Cognitive/behavioral psychotherapeutic interventions.  Participation Level:   Active  Participation Quality:  Appropriate      Behavioral Observation:  Well Groomed, Alert, and Appropriate.   Current Psychosocial Factors: The patient comes in and reports that he is remain free from alcohol since her last visit. The patient also spent the weekend with his wife at a marriage building retreat and reports that he feels like he was very helpful for them as a couple. He reports that he was able to remain completely free and away from any alcohol and denies  any cravings at this point.  Content of Session:   Reviewed current symptoms and work on therapeutic interventions are in issues related to his recurrent depression and anxiety as well as his recurrent alcohol events.  Current Status:   The patient reports that he has continued to be alcohol free and reports that he is actively going to [employer] and working on coping skills and strategies we have develop.  Patient Progress:   Stable  Target Goals:   Target goals include reducing intensity, severity, duration of depressive and a anxiety events as well as maintaining sobriety.  Last Reviewed:    10/18/2015  Goals Addressed Today:    Goals addressed today had to do with strategies for ongoing sobriety as well as developing skills to better manage his depression and anxiety.  Impression/Diagnosis:   The patient describes a long-standing history of underlying anxiety and depression/agitation. The patient reports that his alcohol abuse has correlated with changes in his mood and appetite. Patient reports that he knows that he uses alcohol to try to self medicate negative emotional states.  Diagnosis:    Axis I: Major depressive disorder, recurrent episode, moderate (HCC)  Alcohol abuse

## 2015-10-31 DIAGNOSIS — H353131 Nonexudative age-related macular degeneration, bilateral, early dry stage: Secondary | ICD-10-CM | POA: Diagnosis not present

## 2015-10-31 DIAGNOSIS — H353221 Exudative age-related macular degeneration, left eye, with active choroidal neovascularization: Secondary | ICD-10-CM | POA: Diagnosis not present

## 2015-11-17 ENCOUNTER — Ambulatory Visit (INDEPENDENT_AMBULATORY_CARE_PROVIDER_SITE_OTHER): Payer: Medicare Other | Admitting: Psychology

## 2015-11-17 ENCOUNTER — Encounter (HOSPITAL_COMMUNITY): Payer: Self-pay | Admitting: Psychology

## 2015-11-17 DIAGNOSIS — F1010 Alcohol abuse, uncomplicated: Secondary | ICD-10-CM

## 2015-11-17 DIAGNOSIS — F331 Major depressive disorder, recurrent, moderate: Secondary | ICD-10-CM

## 2015-11-17 NOTE — Progress Notes (Signed)
Patient:  Marvin White   DOB: 12/28/1945  MR Number: [id number]  Location: [facility] [address] [place] [address] Dept: [phone]  Start: 2 PM End: 3 PM  Provider/Observer:     Edgardo Roys PSYD  Chief Complaint:      Chief Complaint  Patient presents with  . Depression  . Alcohol Problem    Reason For Service:     The patient was referred because of her relapse related to his long-standing alcoholism. Patient is a 70 year old Caucasian male. The patient reports that his last serious relapse was at the end of July of this year. The patient reports that over weekend he drank a half a gallon of alcohol. He has not had any alcohol since that time. He does have a history of significant alcohol events and in years past was an everyday drinker. However, he is not doing the everyday drinking. The patient reports that he would like to remain free of alcohol.  The patient also reports that he has a long history of recurrent depressive symptoms. The patient describes moderate significant symptoms of depression, anxiety, mood disturbance, appetite changes, irritability and agitation, panic events, hyperactivity, racing thoughts, and some excessive worrying. He also has times where he describes as auditory hallucinations but this is not frequent.  Interventions Strategy:  Cognitive/behavioral psychotherapeutic interventions.  Participation Level:   Active  Participation Quality:  Appropriate      Behavioral Observation:  Well Groomed, Alert, and Appropriate.   Current Psychosocial Factors: The patient reports that he has been continuing to do quite well with his sobriety. He reports that issues between he and his wife have improved somewhat recently as well.  Content of Session:   Reviewed current symptoms and work on therapeutic interventions are in issues related to his recurrent depression  and anxiety as well as his recurrent alcohol events.  Current Status:   The patient reports that he has continued to be alcohol free and reports that he is actively going to [employer] and working on coping skills and strategies we have develop.  Patient Progress:   Stable  Target Goals:   Target goals include reducing intensity, severity, duration of depressive and a anxiety events as well as maintaining sobriety.  Last Reviewed:    09/20/2015  Goals Addressed Today:    Goals addressed today had to do with strategies for ongoing sobriety as well as developing skills to better manage his depression and anxiety.  Impression/Diagnosis:   The patient describes a long-standing history of underlying anxiety and depression/agitation. The patient reports that his alcohol abuse has correlated with changes in his mood and appetite. Patient reports that he knows that he uses alcohol to try to self medicate negative emotional states.  Diagnosis:    Axis I: Major depressive disorder, recurrent episode, moderate (HCC)  Alcohol abuse

## 2015-11-30 DIAGNOSIS — H353131 Nonexudative age-related macular degeneration, bilateral, early dry stage: Secondary | ICD-10-CM | POA: Diagnosis not present

## 2015-11-30 DIAGNOSIS — H353221 Exudative age-related macular degeneration, left eye, with active choroidal neovascularization: Secondary | ICD-10-CM | POA: Diagnosis not present

## 2015-12-19 DIAGNOSIS — K210 Gastro-esophageal reflux disease with esophagitis: Secondary | ICD-10-CM | POA: Diagnosis not present

## 2015-12-19 DIAGNOSIS — I10 Essential (primary) hypertension: Secondary | ICD-10-CM | POA: Diagnosis not present

## 2015-12-22 ENCOUNTER — Ambulatory Visit (HOSPITAL_COMMUNITY): Payer: Self-pay | Admitting: Psychology

## 2015-12-22 DIAGNOSIS — I10 Essential (primary) hypertension: Secondary | ICD-10-CM | POA: Diagnosis not present

## 2015-12-22 DIAGNOSIS — Z1159 Encounter for screening for other viral diseases: Secondary | ICD-10-CM | POA: Diagnosis not present

## 2015-12-28 ENCOUNTER — Ambulatory Visit (INDEPENDENT_AMBULATORY_CARE_PROVIDER_SITE_OTHER): Payer: Medicare Other | Admitting: Psychology

## 2015-12-28 ENCOUNTER — Encounter (HOSPITAL_COMMUNITY): Payer: Self-pay | Admitting: Psychology

## 2015-12-28 DIAGNOSIS — F331 Major depressive disorder, recurrent, moderate: Secondary | ICD-10-CM | POA: Diagnosis not present

## 2015-12-28 NOTE — Progress Notes (Signed)
Patient:  Marvin White   DOB: 1945/10/30  MR Number: [id number]  Location: [facility] [address] [place] [address] Dept: [phone]  Start: 10 AM End: 11 AM  Provider/Observer:     Edgardo Roys PSYD  Chief Complaint:      Chief Complaint  Patient presents with  . Depression  . Alcohol Problem    Reason For Service:     The patient was referred because of her relapse related to his long-standing alcoholism. Patient is a 70 year old Caucasian male. The patient reports that his last serious relapse was at the end of July of this year. The patient reports that over weekend he drank a half a gallon of alcohol. He has not had any alcohol since that time. He does have a history of significant alcohol events and in years past was an everyday drinker. However, he is not doing the everyday drinking. The patient reports that he would like to remain free of alcohol.  The patient also reports that he has a long history of recurrent depressive symptoms. The patient describes moderate significant symptoms of depression, anxiety, mood disturbance, appetite changes, irritability and agitation, panic events, hyperactivity, racing thoughts, and some excessive worrying. He also has times where he describes as auditory hallucinations but this is not frequent.  Interventions Strategy:  Cognitive/behavioral psychotherapeutic interventions.  Participation Level:   Active  Participation Quality:  Appropriate      Behavioral Observation:  Well Groomed, Alert, and Appropriate.   Current Psychosocial Factors: The patient reports that he and his wife had a very nice vacation to [place] and they felt that it is very therapeutic for him. He reports that he was alcohol free and has been so for quite some time now. The patient reports that he had no situations when he was there where he had cravings for  alcohol. The patient does realize that he needs to maintain vigilance but is feeling very positive about all of his efforts.  Content of Session:   Reviewed current symptoms and work on therapeutic interventions are in issues related to his recurrent depression and anxiety as well as his recurrent alcohol events.  Current Status:   The patient reports that he has continued to be alcohol free and reports that he is actively going to [employer] and working on coping skills and strategies we have develop.  The patient reports that depression is been To the very minimum and he has been actively working on coping skills and strategies.  Patient Progress:   Stable  Target Goals:   Target goals include reducing intensity, severity, duration of depressive and a anxiety events as well as maintaining sobriety.  Last Reviewed:    12/28/2015  Goals Addressed Today:    Goals addressed today had to do with strategies for ongoing sobriety as well as developing skills to better manage his depression and anxiety.  Impression/Diagnosis:   The patient describes a long-standing history of underlying anxiety and depression/agitation. The patient reports that his alcohol abuse has correlated with changes in his mood and appetite. Patient reports that he knows that he uses alcohol to try to self medicate negative emotional states.  Diagnosis:    Axis I: Major depressive disorder, recurrent episode, moderate ([facility])

## 2016-01-04 DIAGNOSIS — H353221 Exudative age-related macular degeneration, left eye, with active choroidal neovascularization: Secondary | ICD-10-CM | POA: Diagnosis not present

## 2016-01-15 DIAGNOSIS — K210 Gastro-esophageal reflux disease with esophagitis: Secondary | ICD-10-CM | POA: Diagnosis not present

## 2016-01-15 DIAGNOSIS — J018 Other acute sinusitis: Secondary | ICD-10-CM | POA: Diagnosis not present

## 2016-01-15 DIAGNOSIS — I10 Essential (primary) hypertension: Secondary | ICD-10-CM | POA: Diagnosis not present

## 2016-01-23 ENCOUNTER — Telehealth (HOSPITAL_COMMUNITY): Payer: Self-pay | Admitting: *Deleted

## 2016-01-26 ENCOUNTER — Ambulatory Visit (HOSPITAL_COMMUNITY): Payer: Self-pay | Admitting: Psychology

## 2016-02-06 ENCOUNTER — Ambulatory Visit (HOSPITAL_COMMUNITY): Payer: Medicare Other | Admitting: Psychology

## 2016-02-15 DIAGNOSIS — H353221 Exudative age-related macular degeneration, left eye, with active choroidal neovascularization: Secondary | ICD-10-CM | POA: Diagnosis not present

## 2016-02-23 ENCOUNTER — Ambulatory Visit (HOSPITAL_COMMUNITY): Payer: Medicare Other | Admitting: Psychology

## 2016-02-27 ENCOUNTER — Encounter (HOSPITAL_COMMUNITY): Payer: Self-pay | Admitting: Psychology

## 2016-02-27 ENCOUNTER — Ambulatory Visit (INDEPENDENT_AMBULATORY_CARE_PROVIDER_SITE_OTHER): Payer: Medicare Other | Admitting: Psychology

## 2016-02-27 DIAGNOSIS — F331 Major depressive disorder, recurrent, moderate: Secondary | ICD-10-CM | POA: Diagnosis not present

## 2016-02-27 DIAGNOSIS — F1010 Alcohol abuse, uncomplicated: Secondary | ICD-10-CM | POA: Diagnosis not present

## 2016-02-27 NOTE — Progress Notes (Signed)
Patient:  Marvin White   DOB: 05-29-1946  MR Number: [id number]  Location: [facility] [address] [place] [address] Dept: [phone]  Start: 9 AM End: 10 AM  Provider/Observer:     Edgardo Roys PSYD  Chief Complaint:      Chief Complaint  Patient presents with  . Depression  . Agitation  . Alcohol Problem    Reason For Service:     The patient was referred because of her relapse related to his long-standing alcoholism. Patient is a 70 year old Caucasian male. The patient reports that his last serious relapse was at the end of July of this year. The patient reports that over weekend he drank a half a gallon of alcohol. He has not had any alcohol since that time. He does have a history of significant alcohol events and in years past was an everyday drinker. However, he is not doing the everyday drinking. The patient reports that he would like to remain free of alcohol.  The patient also reports that he has a long history of recurrent depressive symptoms. The patient describes moderate significant symptoms of depression, anxiety, mood disturbance, appetite changes, irritability and agitation, panic events, hyperactivity, racing thoughts, and some excessive worrying. He also has times where he describes as auditory hallucinations but this is not frequent.  Interventions Strategy:  Cognitive/behavioral psychotherapeutic interventions.  Participation Level:   Active  Participation Quality:  Appropriate      Behavioral Observation:  Well Groomed, Alert, and Appropriate.   Current Psychosocial Factors: The patient reports that he and his wife had a very nice vacation to [place] and they felt that it is very therapeutic for him. He reports that he was alcohol free and has been so for quite some time now. The patient reports that he had no situations when he was there where he had  cravings for alcohol. The patient does realize that he needs to maintain vigilance but is feeling very positive about all of his efforts.  Content of Session:   Reviewed current symptoms and work on therapeutic interventions are in issues related to his recurrent depression and anxiety as well as his recurrent alcohol events.  Current Status:   The patient reports that he has continued to be alcohol free and reports that he is actively going to [employer] and working on coping skills and strategies we have develop.  The patient reports that depression is been To the very minimum and he has been actively working on coping skills and strategies.  Patient Progress:   Stable  Target Goals:   Target goals include reducing intensity, severity, duration of depressive and a anxiety events as well as maintaining sobriety.  Last Reviewed:    02/27/2016  Goals Addressed Today:    Goals addressed today had to do with strategies for ongoing sobriety as well as developing skills to better manage his depression and anxiety.  Impression/Diagnosis:   The patient describes a long-standing history of underlying anxiety and depression/agitation. The patient reports that his alcohol abuse has correlated with changes in his mood and appetite. Patient reports that he knows that he uses alcohol to try to self medicate negative emotional states.  Diagnosis:    Axis I: Major depressive disorder, recurrent episode, moderate (HCC)  Alcohol abuse

## 2016-02-28 NOTE — Progress Notes (Signed)
Patient:  Marvin White   DOB: 18-Apr-1946  MR Number: [id number]  Location: [facility] [address] [place] [address] Dept: [phone]  Start: 2 PM End: 3 PM  Provider/Observer:     Edgardo Roys PSYD  Chief Complaint:      Chief Complaint  Patient presents with  . Alcohol Problem  . Agitation  . Depression    Reason For Service:     The patient was referred because of her relapse related to his long-standing alcoholism. Patient is a 70 year old Caucasian male. The patient reports that his last serious relapse was at the end of July of this year. The patient reports that over weekend he drank a half a gallon of alcohol. He has not had any alcohol since that time. He does have a history of significant alcohol events and in years past was an everyday drinker. However, he is not doing the everyday drinking. The patient reports that he would like to remain free of alcohol.  The patient also reports that he has a long history of recurrent depressive symptoms. The patient describes moderate significant symptoms of depression, anxiety, mood disturbance, appetite changes, irritability and agitation, panic events, hyperactivity, racing thoughts, and some excessive worrying. He also has times where he describes as auditory hallucinations but this is not frequent.  Interventions Strategy:  Cognitive/behavioral psychotherapeutic interventions.  Participation Level:   Active  Participation Quality:  Appropriate      Behavioral Observation:  Well Groomed, Alert, and Appropriate.   Current Psychosocial Factors: The patient comes in and reports that he is remain free from alcohol since her last visit. The patient also spent the weekend with his wife at a marriage building retreat and reports that he feels like he was very helpful for them as a couple. He reports that he was able to remain completely  free and away from any alcohol and denies any cravings at this point.  Content of Session:   Reviewed current symptoms and work on therapeutic interventions are in issues related to his recurrent depression and anxiety as well as his recurrent alcohol events.  Current Status:   The patient reports that he has continued to be alcohol free and reports that he is actively going to [employer] and working on coping skills and strategies we have develop.  Patient Progress:   Stable  Target Goals:   Target goals include reducing intensity, severity, duration of depressive and a anxiety events as well as maintaining sobriety.  Last Reviewed:    11/17/2015  Goals Addressed Today:    Goals addressed today had to do with strategies for ongoing sobriety as well as developing skills to better manage his depression and anxiety.  Impression/Diagnosis:   The patient describes a long-standing history of underlying anxiety and depression/agitation. The patient reports that his alcohol abuse has correlated with changes in his mood and appetite. Patient reports that he knows that he uses alcohol to try to self medicate negative emotional states.  Diagnosis:    Axis I: Major depressive disorder, recurrent episode, moderate (HCC)  Alcohol abuse

## 2016-03-07 DIAGNOSIS — K210 Gastro-esophageal reflux disease with esophagitis: Secondary | ICD-10-CM | POA: Diagnosis not present

## 2016-03-07 DIAGNOSIS — E784 Other hyperlipidemia: Secondary | ICD-10-CM | POA: Diagnosis not present

## 2016-03-07 DIAGNOSIS — N182 Chronic kidney disease, stage 2 (mild): Secondary | ICD-10-CM | POA: Diagnosis not present

## 2016-03-07 DIAGNOSIS — I10 Essential (primary) hypertension: Secondary | ICD-10-CM | POA: Diagnosis not present

## 2016-03-13 DIAGNOSIS — Z131 Encounter for screening for diabetes mellitus: Secondary | ICD-10-CM | POA: Diagnosis not present

## 2016-03-13 DIAGNOSIS — I10 Essential (primary) hypertension: Secondary | ICD-10-CM | POA: Diagnosis not present

## 2016-03-21 DIAGNOSIS — K210 Gastro-esophageal reflux disease with esophagitis: Secondary | ICD-10-CM | POA: Diagnosis not present

## 2016-03-21 DIAGNOSIS — I10 Essential (primary) hypertension: Secondary | ICD-10-CM | POA: Diagnosis not present

## 2016-03-21 DIAGNOSIS — J302 Other seasonal allergic rhinitis: Secondary | ICD-10-CM | POA: Diagnosis not present

## 2016-03-21 DIAGNOSIS — Z0000 Encounter for general adult medical examination without abnormal findings: Secondary | ICD-10-CM | POA: Diagnosis not present

## 2016-03-21 DIAGNOSIS — Z1389 Encounter for screening for other disorder: Secondary | ICD-10-CM | POA: Diagnosis not present

## 2016-03-29 DIAGNOSIS — Z1211 Encounter for screening for malignant neoplasm of colon: Secondary | ICD-10-CM | POA: Diagnosis not present

## 2016-04-03 ENCOUNTER — Ambulatory Visit (INDEPENDENT_AMBULATORY_CARE_PROVIDER_SITE_OTHER): Payer: Medicare Other | Admitting: Psychology

## 2016-04-03 DIAGNOSIS — F331 Major depressive disorder, recurrent, moderate: Secondary | ICD-10-CM

## 2016-04-03 DIAGNOSIS — F1010 Alcohol abuse, uncomplicated: Secondary | ICD-10-CM | POA: Diagnosis not present

## 2016-04-04 DIAGNOSIS — H353221 Exudative age-related macular degeneration, left eye, with active choroidal neovascularization: Secondary | ICD-10-CM | POA: Diagnosis not present

## 2016-04-05 ENCOUNTER — Encounter (HOSPITAL_COMMUNITY): Payer: Self-pay | Admitting: Psychology

## 2016-04-05 NOTE — Progress Notes (Signed)
Patient:  Marvin White   DOB: 1946-01-08  MR Number: [id number]  Location: [facility] [address] [place] [address] Dept: [phone]  Start: 9 AM End: 10 AM  Provider/Observer:     Edgardo Roys PSYD  Chief Complaint:      Chief Complaint  Patient presents with  . Agitation  . Stress    Reason For Service:     The patient was referred because of her relapse related to his long-standing alcoholism. Patient is a 70 year old Caucasian male. The patient reports that his last serious relapse was at the end of July of this year. The patient reports that over weekend he drank a half a gallon of alcohol. He has not had any alcohol since that time. He does have a history of significant alcohol events and in years past was an everyday drinker. However, he is not doing the everyday drinking. The patient reports that he would like to remain free of alcohol.  The patient also reports that he has a long history of recurrent depressive symptoms. The patient describes moderate significant symptoms of depression, anxiety, mood disturbance, appetite changes, irritability and agitation, panic events, hyperactivity, racing thoughts, and some excessive worrying. He also has times where he describes as auditory hallucinations but this is not frequent.  Interventions Strategy:  Cognitive/behavioral psychotherapeutic interventions.  Participation Level:   Active  Participation Quality:  Appropriate      Behavioral Observation:  Well Groomed, Alert, and Appropriate.   Current Psychosocial Factors: The patient reports that he and his wife had a very nice vacation to [place] and they felt that it is very therapeutic for him. He reports that he was alcohol free and has been so for quite some time now. The patient reports that he had no situations when he was there where he had cravings for alcohol. The  patient does realize that he needs to maintain vigilance but is feeling very positive about all of his efforts.  Content of Session:   Reviewed current symptoms and work on therapeutic interventions are in issues related to his recurrent depression and anxiety as well as his recurrent alcohol events.  Current Status:   The patient reports that he has continued to be alcohol free and reports that he is actively going to [employer] and working on coping skills and strategies we have develop.  The patient reports that depression is been To the very minimum and he has been actively working on coping skills and strategies.  Patient Progress:   Stable  Target Goals:   Target goals include reducing intensity, severity, duration of depressive and a anxiety events as well as maintaining sobriety.  Last Reviewed:    04/03/2016  Goals Addressed Today:    Goals addressed today had to do with strategies for ongoing sobriety as well as developing skills to better manage his depression and anxiety.  Impression/Diagnosis:   The patient describes a long-standing history of underlying anxiety and depression/agitation. The patient reports that his alcohol abuse has correlated with changes in his mood and appetite. Patient reports that he knows that he uses alcohol to try to self medicate negative emotional states.  Diagnosis:    Axis I: Major depressive disorder, recurrent episode, moderate (HCC)  Alcohol abuse

## 2016-04-09 DIAGNOSIS — K210 Gastro-esophageal reflux disease with esophagitis: Secondary | ICD-10-CM | POA: Diagnosis not present

## 2016-04-09 DIAGNOSIS — I10 Essential (primary) hypertension: Secondary | ICD-10-CM | POA: Diagnosis not present

## 2016-04-09 DIAGNOSIS — E784 Other hyperlipidemia: Secondary | ICD-10-CM | POA: Diagnosis not present

## 2016-04-09 DIAGNOSIS — N182 Chronic kidney disease, stage 2 (mild): Secondary | ICD-10-CM | POA: Diagnosis not present

## 2016-04-23 DIAGNOSIS — I10 Essential (primary) hypertension: Secondary | ICD-10-CM | POA: Diagnosis not present

## 2016-04-23 DIAGNOSIS — E784 Other hyperlipidemia: Secondary | ICD-10-CM | POA: Diagnosis not present

## 2016-04-23 DIAGNOSIS — N182 Chronic kidney disease, stage 2 (mild): Secondary | ICD-10-CM | POA: Diagnosis not present

## 2016-04-23 DIAGNOSIS — K210 Gastro-esophageal reflux disease with esophagitis: Secondary | ICD-10-CM | POA: Diagnosis not present

## 2016-05-15 ENCOUNTER — Ambulatory Visit (INDEPENDENT_AMBULATORY_CARE_PROVIDER_SITE_OTHER): Payer: Medicare Other | Admitting: Psychology

## 2016-05-15 ENCOUNTER — Encounter (HOSPITAL_COMMUNITY): Payer: Self-pay | Admitting: Psychology

## 2016-05-15 DIAGNOSIS — F331 Major depressive disorder, recurrent, moderate: Secondary | ICD-10-CM

## 2016-05-15 DIAGNOSIS — F1010 Alcohol abuse, uncomplicated: Secondary | ICD-10-CM

## 2016-05-15 NOTE — Progress Notes (Signed)
Patient:  Marvin White   DOB: 12-21-1945  MR Number: [id number]  Location: [facility] [address] [place] [address] Dept: [phone]  Start: 9 AM End: 10 AM  Provider/Observer:     Edgardo Roys PSYD  Chief Complaint:      Chief Complaint  Patient presents with  . Alcohol Problem    Reason For Service:     The patient was referred because of her relapse related to his long-standing alcoholism. Patient is a 70 year old Caucasian male. The patient reports that his last serious relapse was at the end of July of this year. The patient reports that over weekend he drank a half a gallon of alcohol. He has not had any alcohol since that time. He does have a history of significant alcohol events and in years past was an everyday drinker. However, he is not doing the everyday drinking. The patient reports that he would like to remain free of alcohol.  The patient also reports that he has a long history of recurrent depressive symptoms. The patient describes moderate significant symptoms of depression, anxiety, mood disturbance, appetite changes, irritability and agitation, panic events, hyperactivity, racing thoughts, and some excessive worrying. He also has times where he describes as auditory hallucinations but this is not frequent.  Interventions Strategy:  Cognitive/behavioral psychotherapeutic interventions.  Participation Level:   Active  Participation Quality:  Appropriate      Behavioral Observation:  Well Groomed, Alert, and Appropriate.   Current Psychosocial Factors: The patient reports that he and his wife had a very nice vacation to [place] and they felt that it is very therapeutic for him. He reports that he was alcohol free and has been so for quite some time now. The patient reports that he had no situations when he was there where he had cravings for alcohol. The patient  does realize that he needs to maintain vigilance but is feeling very positive about all of his efforts.  Content of Session:   Reviewed current symptoms and work on therapeutic interventions are in issues related to his recurrent depression and anxiety as well as his recurrent alcohol events.  Current Status:   The patient reports that he has continued to be alcohol free and reports that he is actively going to [employer] and working on coping skills and strategies we have develop.  The patient reports that depression is been To the very minimum and he has been actively working on coping skills and strategies.  Patient Progress:   Stable  Target Goals:   Target goals include reducing intensity, severity, duration of depressive and a anxiety events as well as maintaining sobriety.  Last Reviewed:    05/15/2016  Goals Addressed Today:    Goals addressed today had to do with strategies for ongoing sobriety as well as developing skills to better manage his depression and anxiety.  Impression/Diagnosis:   The patient describes a long-standing history of underlying anxiety and depression/agitation. The patient reports that his alcohol abuse has correlated with changes in his mood and appetite. Patient reports that he knows that he uses alcohol to try to self medicate negative emotional states.  Diagnosis:    Axis I: Major depressive disorder, recurrent episode, moderate (HCC)  Alcohol abuse

## 2016-05-29 DIAGNOSIS — K210 Gastro-esophageal reflux disease with esophagitis: Secondary | ICD-10-CM | POA: Diagnosis not present

## 2016-05-29 DIAGNOSIS — I10 Essential (primary) hypertension: Secondary | ICD-10-CM | POA: Diagnosis not present

## 2016-05-29 DIAGNOSIS — N182 Chronic kidney disease, stage 2 (mild): Secondary | ICD-10-CM | POA: Diagnosis not present

## 2016-05-29 DIAGNOSIS — E784 Other hyperlipidemia: Secondary | ICD-10-CM | POA: Diagnosis not present

## 2016-06-03 DIAGNOSIS — H353131 Nonexudative age-related macular degeneration, bilateral, early dry stage: Secondary | ICD-10-CM | POA: Diagnosis not present

## 2016-06-03 DIAGNOSIS — H353221 Exudative age-related macular degeneration, left eye, with active choroidal neovascularization: Secondary | ICD-10-CM | POA: Diagnosis not present

## 2016-06-06 DIAGNOSIS — M7552 Bursitis of left shoulder: Secondary | ICD-10-CM | POA: Diagnosis not present

## 2016-06-21 DIAGNOSIS — K210 Gastro-esophageal reflux disease with esophagitis: Secondary | ICD-10-CM | POA: Diagnosis not present

## 2016-06-21 DIAGNOSIS — I10 Essential (primary) hypertension: Secondary | ICD-10-CM | POA: Diagnosis not present

## 2016-06-26 ENCOUNTER — Ambulatory Visit (INDEPENDENT_AMBULATORY_CARE_PROVIDER_SITE_OTHER): Payer: Medicare Other | Admitting: Psychology

## 2016-06-26 DIAGNOSIS — F331 Major depressive disorder, recurrent, moderate: Secondary | ICD-10-CM | POA: Diagnosis not present

## 2016-06-26 DIAGNOSIS — F1010 Alcohol abuse, uncomplicated: Secondary | ICD-10-CM

## 2016-06-28 ENCOUNTER — Encounter (HOSPITAL_COMMUNITY): Payer: Self-pay | Admitting: Psychology

## 2016-06-28 NOTE — Progress Notes (Signed)
Patient:  Marvin White   DOB: [birth year]  MR Number: [id number]  Location: [facility] [address] [place] [address] Dept: [phone]  Start: 9 AM End: 10 AM  Provider/Observer:     Edgardo Roys PSYD  Chief Complaint:      No chief complaint on file.   Reason For Service:     The patient was referred because of her relapse related to his long-standing alcoholism. Patient is a 70 year old Caucasian male. The patient reports that his last serious relapse was at the end of July of this year. The patient reports that over weekend he drank a half a gallon of alcohol. He has not had any alcohol since that time. He does have a history of significant alcohol events and in years past was an everyday drinker. However, he is not doing the everyday drinking. The patient reports that he would like to remain free of alcohol.  The patient also reports that he has a long history of recurrent depressive symptoms. The patient describes moderate significant symptoms of depression, anxiety, mood disturbance, appetite changes, irritability and agitation, panic events, hyperactivity, racing thoughts, and some excessive worrying. He also has times where he describes as auditory hallucinations but this is not frequent.  Interventions Strategy:  Cognitive/behavioral psychotherapeutic interventions.  Participation Level:   Active  Participation Quality:  Appropriate      Behavioral Observation:  Well Groomed, Alert, and Appropriate.   Current Psychosocial Factors: The patient reports that he and his wife had a very nice vacation to [place] and they felt that it is very therapeutic for him. He reports that he was alcohol free and has been so for quite some time now. The patient reports that he had no situations when he was there where he had cravings for alcohol. The patient does realize that he needs to  maintain vigilance but is feeling very positive about all of his efforts.  Content of Session:   Reviewed current symptoms and work on therapeutic interventions are in issues related to his recurrent depression and anxiety as well as his recurrent alcohol events.  Current Status:   The patient reports that he has continued to be alcohol free and reports that he is actively going to [employer] and working on coping skills and strategies we have develop.  The patient reports that depression is been To the very minimum and he has been actively working on coping skills and strategies.  Patient Progress:   Stable  Target Goals:   Target goals include reducing intensity, severity, duration of depressive and a anxiety events as well as maintaining sobriety.  Last Reviewed:   06/26/2016  Goals Addressed Today:    Goals addressed today had to do with strategies for ongoing sobriety as well as developing skills to better manage his depression and anxiety.  Impression/Diagnosis:   The patient describes a long-standing history of underlying anxiety and depression/agitation. The patient reports that his alcohol abuse has correlated with changes in his mood and appetite. Patient reports that he knows that he uses alcohol to try to self medicate negative emotional states.  Diagnosis:    Axis I: Major depressive disorder, recurrent episode, moderate (HCC)  Alcohol abuse in remission

## 2016-07-19 DIAGNOSIS — I10 Essential (primary) hypertension: Secondary | ICD-10-CM | POA: Diagnosis not present

## 2016-07-19 DIAGNOSIS — N182 Chronic kidney disease, stage 2 (mild): Secondary | ICD-10-CM | POA: Diagnosis not present

## 2016-07-19 DIAGNOSIS — E784 Other hyperlipidemia: Secondary | ICD-10-CM | POA: Diagnosis not present

## 2016-07-19 DIAGNOSIS — K210 Gastro-esophageal reflux disease with esophagitis: Secondary | ICD-10-CM | POA: Diagnosis not present

## 2016-08-01 DIAGNOSIS — H353221 Exudative age-related macular degeneration, left eye, with active choroidal neovascularization: Secondary | ICD-10-CM | POA: Diagnosis not present

## 2016-08-05 DIAGNOSIS — Z23 Encounter for immunization: Secondary | ICD-10-CM | POA: Diagnosis not present

## 2016-08-07 ENCOUNTER — Ambulatory Visit (HOSPITAL_COMMUNITY): Payer: Self-pay | Admitting: Psychology

## 2016-08-09 DIAGNOSIS — R451 Restlessness and agitation: Secondary | ICD-10-CM | POA: Diagnosis not present

## 2016-08-12 ENCOUNTER — Encounter (HOSPITAL_COMMUNITY): Payer: Self-pay | Admitting: Psychology

## 2016-08-12 ENCOUNTER — Ambulatory Visit (INDEPENDENT_AMBULATORY_CARE_PROVIDER_SITE_OTHER): Payer: Medicare Other | Admitting: Psychology

## 2016-08-12 DIAGNOSIS — F1010 Alcohol abuse, uncomplicated: Secondary | ICD-10-CM | POA: Diagnosis not present

## 2016-08-12 DIAGNOSIS — F331 Major depressive disorder, recurrent, moderate: Secondary | ICD-10-CM | POA: Diagnosis not present

## 2016-08-12 NOTE — Progress Notes (Signed)
Patient:  Marvin White   DOB: [birth year]  MR Number: [id number]  Location: [facility] [address] [place] [address] Dept: [phone]  Start: 1 PM  End: 2 PM  Provider/Observer:     Edgardo Roys PSYD  Chief Complaint:      Chief Complaint  Patient presents with  . Agitation    Reason For Service:     The patient was referred because of her relapse related to his long-standing alcoholism. Patient is a 70 year old Caucasian male. The patient reports that his last serious relapse was at the end of July of this year. The patient reports that over weekend he drank a half a gallon of alcohol. He has not had any alcohol since that time. He does have a history of significant alcohol events and in years past was an everyday drinker. However, he is not doing the everyday drinking. The patient reports that he would like to remain free of alcohol.  The patient also reports that he has a long history of recurrent depressive symptoms. The patient describes moderate significant symptoms of depression, anxiety, mood disturbance, appetite changes, irritability and agitation, panic events, hyperactivity, racing thoughts, and some excessive worrying. He also has times where he describes as auditory hallucinations but this is not frequent.  Interventions Strategy:  Cognitive/behavioral psychotherapeutic interventions.  Participation Level:   Active  Participation Quality:  Appropriate      Behavioral Observation:  Well Groomed, Alert, and Appropriate.   Current Psychosocial Factors: The patient reports that He has continued to do quite well. We worked on wrapping of the therapeutic interventions and while his case is closed with regard to interventions with me I will remain available if something happens in the future. However, he has done very well and he is not at it she alcohol for extended period time  and feels very good about it.  Content of Session:   Reviewed current symptoms and work on therapeutic interventions are in issues related to his recurrent depression and anxiety as well as his recurrent alcohol events.  Current Status:   The patient reports that he has continued to be alcohol free and reports that he is actively going to [employer] and working on coping skills and strategies we have develop.  The patient reports that he had difficulty with his atypical but it may be related to weight gain and sleep apnea or could be related to the medication itself. They have discontinued it and given him Klonopin to take at night if need be.  Patient Progress:   Stable  Target Goals:   Target goals include reducing intensity, severity, duration of depressive and a anxiety events as well as maintaining sobriety.  Last Reviewed:   08/12/2016  Goals Addressed Today:    Goals addressed today had to do with strategies for ongoing sobriety as well as developing skills to better manage his depression and anxiety.  Impression/Diagnosis:   The patient describes a long-standing history of underlying anxiety and depression/agitation. The patient reports that his alcohol abuse has correlated with changes in his mood and appetite. Patient reports that he knows that he uses alcohol to try to self medicate negative emotional states.  Diagnosis:    Axis I: Major depressive disorder, recurrent episode, moderate (HCC)  Alcohol abuse in remission

## 2016-08-20 DIAGNOSIS — N182 Chronic kidney disease, stage 2 (mild): Secondary | ICD-10-CM | POA: Diagnosis not present

## 2016-08-20 DIAGNOSIS — I10 Essential (primary) hypertension: Secondary | ICD-10-CM | POA: Diagnosis not present

## 2016-08-20 DIAGNOSIS — K210 Gastro-esophageal reflux disease with esophagitis: Secondary | ICD-10-CM | POA: Diagnosis not present

## 2016-08-20 DIAGNOSIS — E784 Other hyperlipidemia: Secondary | ICD-10-CM | POA: Diagnosis not present

## 2016-09-09 DIAGNOSIS — E784 Other hyperlipidemia: Secondary | ICD-10-CM | POA: Diagnosis not present

## 2016-09-09 DIAGNOSIS — N182 Chronic kidney disease, stage 2 (mild): Secondary | ICD-10-CM | POA: Diagnosis not present

## 2016-09-09 DIAGNOSIS — K210 Gastro-esophageal reflux disease with esophagitis: Secondary | ICD-10-CM | POA: Diagnosis not present

## 2016-09-09 DIAGNOSIS — I10 Essential (primary) hypertension: Secondary | ICD-10-CM | POA: Diagnosis not present

## 2016-09-12 ENCOUNTER — Ambulatory Visit (INDEPENDENT_AMBULATORY_CARE_PROVIDER_SITE_OTHER): Payer: Medicare Other | Admitting: Orthopaedic Surgery

## 2016-09-12 ENCOUNTER — Encounter (INDEPENDENT_AMBULATORY_CARE_PROVIDER_SITE_OTHER): Payer: Self-pay | Admitting: Orthopaedic Surgery

## 2016-09-12 VITALS — BP 121/78 | HR 78 | Ht 66.0 in | Wt 225.0 lb

## 2016-09-12 DIAGNOSIS — M25512 Pain in left shoulder: Secondary | ICD-10-CM

## 2016-09-12 MED ORDER — METHYLPREDNISOLONE ACETATE 40 MG/ML IJ SUSP
40.0000 mg | INTRAMUSCULAR | Status: AC | PRN
  Administered 2016-09-12: 40 mg via INTRA_ARTICULAR

## 2016-09-12 MED ORDER — BUPIVACAINE HCL 0.25 % IJ SOLN
4.0000 mL | INTRAMUSCULAR | Status: AC | PRN
  Administered 2016-09-12: 4 mL via INTRA_ARTICULAR

## 2016-09-12 MED ORDER — LIDOCAINE HCL 1 % IJ SOLN
1.0000 mL | INTRAMUSCULAR | Status: AC | PRN
  Administered 2016-09-12: 1 mL

## 2016-09-12 NOTE — Progress Notes (Signed)
Office Visit Note   Patient: Marvin White           Date of Birth: 1946-02-06           MRN: [id number] Visit Date: 09/12/2016              Requested by: No referring provider defined for this encounter. PCP: No primary care provider on file.   Assessment & Plan: Visit Diagnoses:  1. Acute pain of left shoulder              With the recurrent impingement since 06/06/2016 injection  Plan: Repeat injection performed subacromial space with good relief. If he has persistent symptoms with this he will call us so we can proceed with diagnostic imaging. We discussed some rotator cuff strengthening activities to avoid. Office return as needed  Follow-Up Instructions: No Follow-up on file.   Orders:  Orders Placed This Encounter  Procedures  . Large Joint Injection/Arthrocentesis   No orders of the defined types were placed in this encounter.     Procedures: Large Joint Inj Date/Time: 09/12/2016 3:28 PM Performed by: Marybelle Killings Authorized by: Marybelle Killings   Consent Given by:  Patient Indications:  Pain Location:  Shoulder Site:  L subacromial bursa Needle Size:  22 G Needle Length:  1.5 inches Ultrasound Guidance: No   Fluoroscopic Guidance: No   Arthrogram: No   Medications:  1 mL lidocaine 1 %; 40 mg methylPREDNISolone acetate 40 MG/ML; 4 mL bupivacaine 0.25 % Aspiration Attempted: No   Patient tolerance:  Patient tolerated the procedure well with no immediate complications     Clinical Data: No additional findings.   Subjective: Chief Complaint  Patient presents with  . Left Shoulder - Pain    Patient returns with left shoulder pain. He is having difficulty with raising his arm up and putting his arm behind him.  He received a left shoulder injection on 06/06/16 with relief. He requests another injection today.    Review of Systems 14 point review systems update from last visit 06/06/2016 other than recurrence of left shoulder pain with outstretched  reaching and overhead activities.   Objective: Vital Signs: BP 121/78   Pulse 78   Ht 5\' 6"  (1.676 m)   Wt 225 lb (102.1 kg)   BMI 36.32 kg/m   Physical Exam  Constitutional: He is oriented to person, place, and time. He appears well-developed and well-nourished.  HENT:  Head: Normocephalic and atraumatic.  Eyes: EOM are normal. Pupils are equal, round, and reactive to light.  Neck: No tracheal deviation present. No thyromegaly present.  Cardiovascular: Normal rate.   Pulmonary/Chest: Effort normal. He has no wheezes.  Abdominal: Soft. Bowel sounds are normal.  Musculoskeletal:  Positive impingement left shoulder negative drop arm test lung of the biceps is nontender good cervical range of motion no rash or exposed skin elbow reaches full extension. Normal heel toe gait opposite right shoulder is able to reach over his head without pain.  Neurological: He is alert and oriented to person, place, and time.  Skin: Skin is warm and dry. Capillary refill takes less than 2 seconds.  Psychiatric: He has a normal mood and affect. His behavior is normal. Judgment and thought content normal.    Ortho Exam left shoulder positive Hawkins negative Neer negative Yergason. Negative Adson.  Specialty Comments:  No specialty comments available.  Imaging: No results found.   PMFS History: There are no active problems to display for this  patient.  Past Medical History:  Diagnosis Date  . Chickenpox   . Depression   . High blood pressure   . High cholesterol     No family history on file.  No past surgical history on file. Social History   Occupational History  . Not on file.   Social History Main Topics  . Smoking status: Unknown If Ever Smoked  . Smokeless tobacco: Not on file  . Alcohol use Yes  . Drug use: No  . Sexual activity: Not on file

## 2016-09-20 DIAGNOSIS — Z131 Encounter for screening for diabetes mellitus: Secondary | ICD-10-CM | POA: Diagnosis not present

## 2016-09-20 DIAGNOSIS — Z125 Encounter for screening for malignant neoplasm of prostate: Secondary | ICD-10-CM | POA: Diagnosis not present

## 2016-09-20 DIAGNOSIS — I10 Essential (primary) hypertension: Secondary | ICD-10-CM | POA: Diagnosis not present

## 2016-09-20 DIAGNOSIS — K219 Gastro-esophageal reflux disease without esophagitis: Secondary | ICD-10-CM | POA: Diagnosis not present

## 2016-09-20 DIAGNOSIS — F320 Major depressive disorder, single episode, mild: Secondary | ICD-10-CM | POA: Diagnosis not present

## 2016-09-24 DIAGNOSIS — N182 Chronic kidney disease, stage 2 (mild): Secondary | ICD-10-CM | POA: Diagnosis not present

## 2016-09-24 DIAGNOSIS — I10 Essential (primary) hypertension: Secondary | ICD-10-CM | POA: Diagnosis not present

## 2016-09-24 DIAGNOSIS — E784 Other hyperlipidemia: Secondary | ICD-10-CM | POA: Diagnosis not present

## 2016-09-24 DIAGNOSIS — K210 Gastro-esophageal reflux disease with esophagitis: Secondary | ICD-10-CM | POA: Diagnosis not present

## 2016-10-03 DIAGNOSIS — H353221 Exudative age-related macular degeneration, left eye, with active choroidal neovascularization: Secondary | ICD-10-CM | POA: Diagnosis not present

## 2016-11-14 DIAGNOSIS — I10 Essential (primary) hypertension: Secondary | ICD-10-CM | POA: Diagnosis not present

## 2016-11-14 DIAGNOSIS — N182 Chronic kidney disease, stage 2 (mild): Secondary | ICD-10-CM | POA: Diagnosis not present

## 2016-11-14 DIAGNOSIS — K210 Gastro-esophageal reflux disease with esophagitis: Secondary | ICD-10-CM | POA: Diagnosis not present

## 2016-11-14 DIAGNOSIS — E784 Other hyperlipidemia: Secondary | ICD-10-CM | POA: Diagnosis not present

## 2016-12-10 DIAGNOSIS — H353221 Exudative age-related macular degeneration, left eye, with active choroidal neovascularization: Secondary | ICD-10-CM | POA: Diagnosis not present

## 2016-12-19 DIAGNOSIS — F320 Major depressive disorder, single episode, mild: Secondary | ICD-10-CM | POA: Diagnosis not present

## 2016-12-19 DIAGNOSIS — I10 Essential (primary) hypertension: Secondary | ICD-10-CM | POA: Diagnosis not present

## 2016-12-19 DIAGNOSIS — K219 Gastro-esophageal reflux disease without esophagitis: Secondary | ICD-10-CM | POA: Diagnosis not present

## 2016-12-19 DIAGNOSIS — N182 Chronic kidney disease, stage 2 (mild): Secondary | ICD-10-CM | POA: Diagnosis not present

## 2016-12-31 DIAGNOSIS — N182 Chronic kidney disease, stage 2 (mild): Secondary | ICD-10-CM | POA: Diagnosis not present

## 2016-12-31 DIAGNOSIS — F320 Major depressive disorder, single episode, mild: Secondary | ICD-10-CM | POA: Diagnosis not present

## 2016-12-31 DIAGNOSIS — I10 Essential (primary) hypertension: Secondary | ICD-10-CM | POA: Diagnosis not present

## 2016-12-31 DIAGNOSIS — K219 Gastro-esophageal reflux disease without esophagitis: Secondary | ICD-10-CM | POA: Diagnosis not present

## 2017-01-30 DIAGNOSIS — F320 Major depressive disorder, single episode, mild: Secondary | ICD-10-CM | POA: Diagnosis not present

## 2017-01-30 DIAGNOSIS — I10 Essential (primary) hypertension: Secondary | ICD-10-CM | POA: Diagnosis not present

## 2017-01-30 DIAGNOSIS — N182 Chronic kidney disease, stage 2 (mild): Secondary | ICD-10-CM | POA: Diagnosis not present

## 2017-01-30 DIAGNOSIS — K219 Gastro-esophageal reflux disease without esophagitis: Secondary | ICD-10-CM | POA: Diagnosis not present

## 2017-02-26 DIAGNOSIS — F320 Major depressive disorder, single episode, mild: Secondary | ICD-10-CM | POA: Diagnosis not present

## 2017-02-26 DIAGNOSIS — I10 Essential (primary) hypertension: Secondary | ICD-10-CM | POA: Diagnosis not present

## 2017-02-26 DIAGNOSIS — K219 Gastro-esophageal reflux disease without esophagitis: Secondary | ICD-10-CM | POA: Diagnosis not present

## 2017-02-26 DIAGNOSIS — N182 Chronic kidney disease, stage 2 (mild): Secondary | ICD-10-CM | POA: Diagnosis not present

## 2017-03-13 DIAGNOSIS — H353222 Exudative age-related macular degeneration, left eye, with inactive choroidal neovascularization: Secondary | ICD-10-CM | POA: Diagnosis not present

## 2017-03-13 DIAGNOSIS — H353111 Nonexudative age-related macular degeneration, right eye, early dry stage: Secondary | ICD-10-CM | POA: Diagnosis not present

## 2017-03-13 DIAGNOSIS — H353211 Exudative age-related macular degeneration, right eye, with active choroidal neovascularization: Secondary | ICD-10-CM | POA: Diagnosis not present

## 2017-03-13 DIAGNOSIS — H353121 Nonexudative age-related macular degeneration, left eye, early dry stage: Secondary | ICD-10-CM | POA: Diagnosis not present

## 2017-03-13 DIAGNOSIS — H43811 Vitreous degeneration, right eye: Secondary | ICD-10-CM | POA: Diagnosis not present

## 2017-03-21 DIAGNOSIS — N182 Chronic kidney disease, stage 2 (mild): Secondary | ICD-10-CM | POA: Diagnosis not present

## 2017-03-21 DIAGNOSIS — F320 Major depressive disorder, single episode, mild: Secondary | ICD-10-CM | POA: Diagnosis not present

## 2017-03-21 DIAGNOSIS — I10 Essential (primary) hypertension: Secondary | ICD-10-CM | POA: Diagnosis not present

## 2017-03-21 DIAGNOSIS — K219 Gastro-esophageal reflux disease without esophagitis: Secondary | ICD-10-CM | POA: Diagnosis not present

## 2017-03-28 DIAGNOSIS — E784 Other hyperlipidemia: Secondary | ICD-10-CM | POA: Diagnosis not present

## 2017-03-28 DIAGNOSIS — K210 Gastro-esophageal reflux disease with esophagitis: Secondary | ICD-10-CM | POA: Diagnosis not present

## 2017-03-28 DIAGNOSIS — I10 Essential (primary) hypertension: Secondary | ICD-10-CM | POA: Diagnosis not present

## 2017-04-02 DIAGNOSIS — I10 Essential (primary) hypertension: Secondary | ICD-10-CM | POA: Diagnosis not present

## 2017-04-17 ENCOUNTER — Encounter (INDEPENDENT_AMBULATORY_CARE_PROVIDER_SITE_OTHER): Payer: Self-pay | Admitting: Orthopaedic Surgery

## 2017-04-17 ENCOUNTER — Ambulatory Visit (INDEPENDENT_AMBULATORY_CARE_PROVIDER_SITE_OTHER): Payer: Medicare Other | Admitting: Orthopaedic Surgery

## 2017-04-17 VITALS — BP 103/72 | HR 83 | Ht 66.5 in | Wt 210.0 lb

## 2017-04-17 DIAGNOSIS — M7542 Impingement syndrome of left shoulder: Secondary | ICD-10-CM | POA: Diagnosis not present

## 2017-04-17 MED ORDER — LIDOCAINE HCL 1 % IJ SOLN
1.0000 mL | INTRAMUSCULAR | Status: AC | PRN
  Administered 2017-04-17: 1 mL

## 2017-04-17 MED ORDER — METHYLPREDNISOLONE ACETATE 40 MG/ML IJ SUSP
40.0000 mg | INTRAMUSCULAR | Status: AC | PRN
  Administered 2017-04-17: 40 mg via INTRA_ARTICULAR

## 2017-04-17 MED ORDER — BUPIVACAINE HCL 0.25 % IJ SOLN
4.0000 mL | INTRAMUSCULAR | Status: AC | PRN
  Administered 2017-04-17: 4 mL via INTRA_ARTICULAR

## 2017-04-17 NOTE — Progress Notes (Signed)
Office Visit Note   Patient: Marvin White           Date of Birth: 1946-06-03           MRN: [id number] Visit Date: 04/17/2017              Requested by: No referring provider defined for this encounter. PCP: No primary care provider on file.   Assessment & Plan: Visit Diagnoses:  1. Impingement syndrome of left shoulder     Plan: Patient performed which he tolerated well. Previous injection was in December. Persistent prominence of call let us know we can consider diagnostic MRI scan. He gets good relief we can check him back only as needed.  Follow-Up Instructions: Return if symptoms worsen or fail to improve.   Orders:  Orders Placed This Encounter  Procedures  . Large Joint Injection/Arthrocentesis   No orders of the defined types were placed in this encounter.     Procedures: Large Joint Inj Date/Time: 04/17/2017 1:59 PM Performed by: Marybelle Killings Authorized by: Marybelle Killings   Consent Given by:  Patient Site marked: the procedure site was marked   Indications:  Pain Location:  Shoulder Site:  L subacromial bursa Needle Size:  22 G Needle Length:  1.5 inches Ultrasound Guidance: No   Fluoroscopic Guidance: No   Arthrogram: No   Medications:  1 mL lidocaine 1 %; 40 mg methylPREDNISolone acetate 40 MG/ML; 4 mL bupivacaine 0.25 % Aspiration Attempted: No   Patient tolerance:  Patient tolerated the procedure well with no immediate complications     Clinical Data: No additional findings.   Subjective: Chief Complaint  Patient presents with  . Left Shoulder - Pain    HPI patient call for appointment with recurrent problems with his left shoulder for last several weeks. Had previous injection in December 2017 with good relief and recently had recurrence of pain with aching that bothers him with activities os Richard reaching. He is unable take anti-inflammatories due to his instructions from his primary care physician due to kidney problems. Denies  numbness or tingling in his hand no associated neck problems.  Review of Systems routine for review of systems is updated and is unchanged from December 2017. Of note is a old fracture that required be in traction for many weeks. Occasionally has some mild discomfort from it but nothing consistent.   Objective: Vital Signs: BP 103/72   Pulse 83   Ht 5' 6.5" (1.689 m)   Wt 210 lb (95.3 kg)   BMI 33.39 kg/m   Physical Exam  Constitutional: He is oriented to person, place, and time. He appears well-developed and well-nourished.  HENT:  Head: Normocephalic and atraumatic.  Eyes: Pupils are equal, round, and reactive to light. EOM are normal.  Neck: No tracheal deviation present. No thyromegaly present.  Cardiovascular: Normal rate.   Pulmonary/Chest: Effort normal. He has no wheezes.  Abdominal: Soft. Bowel sounds are normal.  Musculoskeletal:  Good cervical range of motion positive impingement left shoulder negative right. No pain with internal/external rotation right or left hip. The rash overexposed skin no deltoid atrophy. No muscle atrophy the upper extremities.  Neurological: He is alert and oriented to person, place, and time.  Skin: Skin is warm and dry. Capillary refill takes less than 2 seconds.  Psychiatric: He has a normal mood and affect. His behavior is normal. Judgment and thought content normal.    Ortho Exam  Specialty Comments:  No specialty comments available.  Imaging: No results found.   PMFS History: There are no active problems to display for this patient.  Past Medical History:  Diagnosis Date  . Chickenpox   . Depression   . High blood pressure   . High cholesterol     No family history on file.  No past surgical history on file. Social History   Occupational History  . Not on file.   Social History Main Topics  . Smoking status: Unknown If Ever Smoked  . Smokeless tobacco: Never Used  . Alcohol use Yes  . Drug use: No  . Sexual  activity: Not on file

## 2017-04-25 DIAGNOSIS — H35371 Puckering of macula, right eye: Secondary | ICD-10-CM | POA: Diagnosis not present

## 2017-04-25 DIAGNOSIS — H353222 Exudative age-related macular degeneration, left eye, with inactive choroidal neovascularization: Secondary | ICD-10-CM | POA: Diagnosis not present

## 2017-04-25 DIAGNOSIS — H353111 Nonexudative age-related macular degeneration, right eye, early dry stage: Secondary | ICD-10-CM | POA: Diagnosis not present

## 2017-04-25 DIAGNOSIS — H353212 Exudative age-related macular degeneration, right eye, with inactive choroidal neovascularization: Secondary | ICD-10-CM | POA: Diagnosis not present

## 2017-05-09 DIAGNOSIS — I10 Essential (primary) hypertension: Secondary | ICD-10-CM | POA: Diagnosis not present

## 2017-05-09 DIAGNOSIS — K210 Gastro-esophageal reflux disease with esophagitis: Secondary | ICD-10-CM | POA: Diagnosis not present

## 2017-05-09 DIAGNOSIS — E784 Other hyperlipidemia: Secondary | ICD-10-CM | POA: Diagnosis not present

## 2017-06-03 DIAGNOSIS — I10 Essential (primary) hypertension: Secondary | ICD-10-CM | POA: Diagnosis not present

## 2017-06-03 DIAGNOSIS — K210 Gastro-esophageal reflux disease with esophagitis: Secondary | ICD-10-CM | POA: Diagnosis not present

## 2017-06-03 DIAGNOSIS — E784 Other hyperlipidemia: Secondary | ICD-10-CM | POA: Diagnosis not present

## 2017-06-17 DIAGNOSIS — F320 Major depressive disorder, single episode, mild: Secondary | ICD-10-CM | POA: Diagnosis not present

## 2017-06-17 DIAGNOSIS — N182 Chronic kidney disease, stage 2 (mild): Secondary | ICD-10-CM | POA: Diagnosis not present

## 2017-06-17 DIAGNOSIS — G4733 Obstructive sleep apnea (adult) (pediatric): Secondary | ICD-10-CM | POA: Diagnosis not present

## 2017-06-17 DIAGNOSIS — I10 Essential (primary) hypertension: Secondary | ICD-10-CM | POA: Diagnosis not present

## 2017-06-17 DIAGNOSIS — Z0000 Encounter for general adult medical examination without abnormal findings: Secondary | ICD-10-CM | POA: Diagnosis not present

## 2017-06-17 DIAGNOSIS — K219 Gastro-esophageal reflux disease without esophagitis: Secondary | ICD-10-CM | POA: Diagnosis not present

## 2017-06-17 DIAGNOSIS — Z1389 Encounter for screening for other disorder: Secondary | ICD-10-CM | POA: Diagnosis not present

## 2017-06-18 DIAGNOSIS — Z79899 Other long term (current) drug therapy: Secondary | ICD-10-CM | POA: Diagnosis not present

## 2017-06-18 DIAGNOSIS — N182 Chronic kidney disease, stage 2 (mild): Secondary | ICD-10-CM | POA: Diagnosis not present

## 2017-06-18 DIAGNOSIS — Z131 Encounter for screening for diabetes mellitus: Secondary | ICD-10-CM | POA: Diagnosis not present

## 2017-06-18 DIAGNOSIS — I10 Essential (primary) hypertension: Secondary | ICD-10-CM | POA: Diagnosis not present

## 2017-07-15 DIAGNOSIS — Z23 Encounter for immunization: Secondary | ICD-10-CM | POA: Diagnosis not present

## 2017-07-21 DIAGNOSIS — I10 Essential (primary) hypertension: Secondary | ICD-10-CM | POA: Diagnosis not present

## 2017-07-21 DIAGNOSIS — N182 Chronic kidney disease, stage 2 (mild): Secondary | ICD-10-CM | POA: Diagnosis not present

## 2017-07-21 DIAGNOSIS — K219 Gastro-esophageal reflux disease without esophagitis: Secondary | ICD-10-CM | POA: Diagnosis not present

## 2017-08-19 DIAGNOSIS — I10 Essential (primary) hypertension: Secondary | ICD-10-CM | POA: Diagnosis not present

## 2017-08-19 DIAGNOSIS — K219 Gastro-esophageal reflux disease without esophagitis: Secondary | ICD-10-CM | POA: Diagnosis not present

## 2017-08-19 DIAGNOSIS — N182 Chronic kidney disease, stage 2 (mild): Secondary | ICD-10-CM | POA: Diagnosis not present

## 2017-08-21 ENCOUNTER — Encounter (INDEPENDENT_AMBULATORY_CARE_PROVIDER_SITE_OTHER): Payer: Self-pay | Admitting: Orthopaedic Surgery

## 2017-08-21 ENCOUNTER — Ambulatory Visit (INDEPENDENT_AMBULATORY_CARE_PROVIDER_SITE_OTHER): Payer: Medicare Other

## 2017-08-21 ENCOUNTER — Ambulatory Visit (INDEPENDENT_AMBULATORY_CARE_PROVIDER_SITE_OTHER): Payer: Medicare Other | Admitting: Orthopaedic Surgery

## 2017-08-21 VITALS — BP 110/68 | HR 84 | Ht 66.0 in | Wt 214.0 lb

## 2017-08-21 DIAGNOSIS — G8929 Other chronic pain: Secondary | ICD-10-CM

## 2017-08-21 DIAGNOSIS — M7542 Impingement syndrome of left shoulder: Secondary | ICD-10-CM

## 2017-08-21 DIAGNOSIS — M25512 Pain in left shoulder: Secondary | ICD-10-CM

## 2017-08-21 NOTE — Progress Notes (Signed)
Office Visit Note   Patient: Marvin White           Date of Birth: 1945/10/01           MRN: [id number] Visit Date: 08/21/2017              Requested by: No referring provider defined for this encounter. PCP: No primary care provider on file.   Assessment & Plan: Visit Diagnoses:  1. Chronic left shoulder pain   2. Impingement syndrome of left shoulder     Plan: Patient has some calcification of the superior aspect of the glenoid SLAP tear.  Good supraspinatus strength.  He has continued problems we can consider MRI imaging of his left shoulder.  Follow-Up Instructions: No Follow-up on file.   Orders:  Orders Placed This Encounter  Procedures  . Large Joint Inj: L subacromial bursa  . XR Shoulder Left   No orders of the defined types were placed in this encounter.     Procedures: Large Joint Inj: L subacromial bursa on 08/21/2017 4:30 PM Indications: pain Details: 22 G 1.5 in needle  Arthrogram: No  Medications: 4 mL bupivacaine 0.25 %; 40 mg methylPREDNISolone acetate 40 MG/ML; 0.5 mL lidocaine 1 % Outcome: tolerated well, no immediate complications Procedure, treatment alternatives, risks and benefits explained, specific risks discussed. Consent was given by the patient. Immediately prior to procedure a time out was called to verify the correct patient, procedure, equipment, support staff and site/side marked as required. Patient was prepped and draped in the usual sterile fashion.       Clinical Data: No additional findings.   Subjective: Chief Complaint  Patient presents with  . Left Shoulder - Pain    HPI.  Ejection in July gave him relief in the last few weeks has had increased pain.  Patient requested repeat injection today since the previous injection gave him good relief and also repeat x-rays.  Review of Systems 14 point review of systems is unchanged from December 2017 office visit other than as mentioned in HPI.   Objective: Vital Signs:  BP 110/68   Pulse 84   Ht 5\' 6"  (1.676 m)   Wt 214 lb (97.1 kg)   BMI 34.54 kg/m   Physical Exam  Constitutional: He is oriented to person, place, and time. He appears well-developed and well-nourished.  HENT:  Head: Normocephalic and atraumatic.  Eyes: EOM are normal. Pupils are equal, round, and reactive to light.  Neck: No tracheal deviation present. No thyromegaly present.  Cardiovascular: Normal rate.  Pulmonary/Chest: Effort normal. He has no wheezes.  Abdominal: Soft. Bowel sounds are normal.  Neurological: He is alert and oriented to person, place, and time.  Skin: Skin is warm and dry. Capillary refill takes less than 2 seconds.  Psychiatric: He has a normal mood and affect. His behavior is normal. Judgment and thought content normal.    Ortho Exam drop arm test.  No internal/external rotation of the shoulder.  Long head of the biceps is mildly tender.  Upper extremity reflexes are 2+ and symmetrical normal cervical range of motion.  Palpation over the brachial plexus reveals no tenderness.  Normal gait.  No biceps or triceps weakness.  Specialty Comments:  No specialty comments available.  Imaging: No results found.   PMFS History: Patient Active Problem List   Diagnosis Date Noted  . Impingement syndrome of left shoulder 08/25/2017   Past Medical History:  Diagnosis Date  . Chickenpox   . Depression   .  High blood pressure   . High cholesterol     No family history on file.  No past surgical history on file. Social History   Occupational History  . Not on file  Tobacco Use  . Smoking status: Unknown If Ever Smoked  . Smokeless tobacco: Never Used  Substance and Sexual Activity  . Alcohol use: Yes  . Drug use: No  . Sexual activity: Not on file

## 2017-08-25 ENCOUNTER — Encounter (INDEPENDENT_AMBULATORY_CARE_PROVIDER_SITE_OTHER): Payer: Self-pay | Admitting: Orthopaedic Surgery

## 2017-08-25 DIAGNOSIS — M7542 Impingement syndrome of left shoulder: Secondary | ICD-10-CM | POA: Insufficient documentation

## 2017-08-25 MED ORDER — LIDOCAINE HCL 1 % IJ SOLN
0.5000 mL | INTRAMUSCULAR | Status: AC | PRN
  Administered 2017-08-21: .5 mL

## 2017-08-25 MED ORDER — METHYLPREDNISOLONE ACETATE 40 MG/ML IJ SUSP
40.0000 mg | INTRAMUSCULAR | Status: AC | PRN
  Administered 2017-08-21: 40 mg via INTRA_ARTICULAR

## 2017-08-25 MED ORDER — BUPIVACAINE HCL 0.25 % IJ SOLN
4.0000 mL | INTRAMUSCULAR | Status: AC | PRN
  Administered 2017-08-21: 4 mL via INTRA_ARTICULAR

## 2017-09-03 DIAGNOSIS — H353212 Exudative age-related macular degeneration, right eye, with inactive choroidal neovascularization: Secondary | ICD-10-CM | POA: Diagnosis not present

## 2017-09-03 DIAGNOSIS — H353222 Exudative age-related macular degeneration, left eye, with inactive choroidal neovascularization: Secondary | ICD-10-CM | POA: Diagnosis not present

## 2017-09-03 DIAGNOSIS — H35371 Puckering of macula, right eye: Secondary | ICD-10-CM | POA: Diagnosis not present

## 2017-09-03 DIAGNOSIS — H353111 Nonexudative age-related macular degeneration, right eye, early dry stage: Secondary | ICD-10-CM | POA: Diagnosis not present

## 2017-09-09 DIAGNOSIS — K219 Gastro-esophageal reflux disease without esophagitis: Secondary | ICD-10-CM | POA: Diagnosis not present

## 2017-09-09 DIAGNOSIS — F320 Major depressive disorder, single episode, mild: Secondary | ICD-10-CM | POA: Diagnosis not present

## 2017-09-09 DIAGNOSIS — G4733 Obstructive sleep apnea (adult) (pediatric): Secondary | ICD-10-CM | POA: Diagnosis not present

## 2017-09-09 DIAGNOSIS — I10 Essential (primary) hypertension: Secondary | ICD-10-CM | POA: Diagnosis not present

## 2017-09-09 DIAGNOSIS — N182 Chronic kidney disease, stage 2 (mild): Secondary | ICD-10-CM | POA: Diagnosis not present

## 2017-09-17 DIAGNOSIS — I10 Essential (primary) hypertension: Secondary | ICD-10-CM | POA: Diagnosis not present

## 2017-09-17 DIAGNOSIS — F320 Major depressive disorder, single episode, mild: Secondary | ICD-10-CM | POA: Diagnosis not present

## 2017-09-17 DIAGNOSIS — N182 Chronic kidney disease, stage 2 (mild): Secondary | ICD-10-CM | POA: Diagnosis not present

## 2017-09-17 DIAGNOSIS — K219 Gastro-esophageal reflux disease without esophagitis: Secondary | ICD-10-CM | POA: Diagnosis not present

## 2017-09-25 DIAGNOSIS — L853 Xerosis cutis: Secondary | ICD-10-CM | POA: Diagnosis not present

## 2017-09-25 DIAGNOSIS — B360 Pityriasis versicolor: Secondary | ICD-10-CM | POA: Diagnosis not present

## 2017-09-25 DIAGNOSIS — L820 Inflamed seborrheic keratosis: Secondary | ICD-10-CM | POA: Diagnosis not present

## 2017-11-28 DIAGNOSIS — K219 Gastro-esophageal reflux disease without esophagitis: Secondary | ICD-10-CM | POA: Diagnosis not present

## 2017-11-28 DIAGNOSIS — I10 Essential (primary) hypertension: Secondary | ICD-10-CM | POA: Diagnosis not present

## 2017-11-28 DIAGNOSIS — N182 Chronic kidney disease, stage 2 (mild): Secondary | ICD-10-CM | POA: Diagnosis not present

## 2017-11-28 DIAGNOSIS — F320 Major depressive disorder, single episode, mild: Secondary | ICD-10-CM | POA: Diagnosis not present

## 2017-12-09 DIAGNOSIS — I10 Essential (primary) hypertension: Secondary | ICD-10-CM | POA: Diagnosis not present

## 2017-12-09 DIAGNOSIS — N182 Chronic kidney disease, stage 2 (mild): Secondary | ICD-10-CM | POA: Diagnosis not present

## 2017-12-09 DIAGNOSIS — K219 Gastro-esophageal reflux disease without esophagitis: Secondary | ICD-10-CM | POA: Diagnosis not present

## 2017-12-09 DIAGNOSIS — G4733 Obstructive sleep apnea (adult) (pediatric): Secondary | ICD-10-CM | POA: Diagnosis not present

## 2017-12-09 DIAGNOSIS — F320 Major depressive disorder, single episode, mild: Secondary | ICD-10-CM | POA: Diagnosis not present

## 2018-01-02 DIAGNOSIS — Z1211 Encounter for screening for malignant neoplasm of colon: Secondary | ICD-10-CM | POA: Diagnosis not present

## 2018-01-09 DIAGNOSIS — N182 Chronic kidney disease, stage 2 (mild): Secondary | ICD-10-CM | POA: Diagnosis not present

## 2018-01-09 DIAGNOSIS — K219 Gastro-esophageal reflux disease without esophagitis: Secondary | ICD-10-CM | POA: Diagnosis not present

## 2018-01-09 DIAGNOSIS — F320 Major depressive disorder, single episode, mild: Secondary | ICD-10-CM | POA: Diagnosis not present

## 2018-01-09 DIAGNOSIS — I10 Essential (primary) hypertension: Secondary | ICD-10-CM | POA: Diagnosis not present

## 2018-02-11 DIAGNOSIS — F320 Major depressive disorder, single episode, mild: Secondary | ICD-10-CM | POA: Diagnosis not present

## 2018-02-11 DIAGNOSIS — N182 Chronic kidney disease, stage 2 (mild): Secondary | ICD-10-CM | POA: Diagnosis not present

## 2018-02-11 DIAGNOSIS — K219 Gastro-esophageal reflux disease without esophagitis: Secondary | ICD-10-CM | POA: Diagnosis not present

## 2018-02-11 DIAGNOSIS — I10 Essential (primary) hypertension: Secondary | ICD-10-CM | POA: Diagnosis not present

## 2018-03-03 DIAGNOSIS — H43811 Vitreous degeneration, right eye: Secondary | ICD-10-CM | POA: Diagnosis not present

## 2018-03-03 DIAGNOSIS — H353222 Exudative age-related macular degeneration, left eye, with inactive choroidal neovascularization: Secondary | ICD-10-CM | POA: Diagnosis not present

## 2018-03-03 DIAGNOSIS — H35371 Puckering of macula, right eye: Secondary | ICD-10-CM | POA: Diagnosis not present

## 2018-03-03 DIAGNOSIS — H353121 Nonexudative age-related macular degeneration, left eye, early dry stage: Secondary | ICD-10-CM | POA: Diagnosis not present

## 2018-03-05 DIAGNOSIS — H5203 Hypermetropia, bilateral: Secondary | ICD-10-CM | POA: Diagnosis not present

## 2018-03-05 DIAGNOSIS — H353131 Nonexudative age-related macular degeneration, bilateral, early dry stage: Secondary | ICD-10-CM | POA: Diagnosis not present

## 2018-03-05 DIAGNOSIS — H52223 Regular astigmatism, bilateral: Secondary | ICD-10-CM | POA: Diagnosis not present

## 2018-03-05 DIAGNOSIS — H2513 Age-related nuclear cataract, bilateral: Secondary | ICD-10-CM | POA: Diagnosis not present

## 2018-03-10 DIAGNOSIS — K219 Gastro-esophageal reflux disease without esophagitis: Secondary | ICD-10-CM | POA: Diagnosis not present

## 2018-03-10 DIAGNOSIS — I10 Essential (primary) hypertension: Secondary | ICD-10-CM | POA: Diagnosis not present

## 2018-03-10 DIAGNOSIS — N182 Chronic kidney disease, stage 2 (mild): Secondary | ICD-10-CM | POA: Diagnosis not present

## 2018-03-10 DIAGNOSIS — F320 Major depressive disorder, single episode, mild: Secondary | ICD-10-CM | POA: Diagnosis not present

## 2018-03-10 DIAGNOSIS — G4733 Obstructive sleep apnea (adult) (pediatric): Secondary | ICD-10-CM | POA: Diagnosis not present

## 2018-04-21 DIAGNOSIS — I10 Essential (primary) hypertension: Secondary | ICD-10-CM | POA: Diagnosis not present

## 2018-04-21 DIAGNOSIS — N182 Chronic kidney disease, stage 2 (mild): Secondary | ICD-10-CM | POA: Diagnosis not present

## 2018-04-21 DIAGNOSIS — K219 Gastro-esophageal reflux disease without esophagitis: Secondary | ICD-10-CM | POA: Diagnosis not present

## 2018-06-10 DIAGNOSIS — I10 Essential (primary) hypertension: Secondary | ICD-10-CM | POA: Diagnosis not present

## 2018-06-10 DIAGNOSIS — G4733 Obstructive sleep apnea (adult) (pediatric): Secondary | ICD-10-CM | POA: Diagnosis not present

## 2018-06-10 DIAGNOSIS — K219 Gastro-esophageal reflux disease without esophagitis: Secondary | ICD-10-CM | POA: Diagnosis not present

## 2018-06-10 DIAGNOSIS — F320 Major depressive disorder, single episode, mild: Secondary | ICD-10-CM | POA: Diagnosis not present

## 2018-06-10 DIAGNOSIS — N182 Chronic kidney disease, stage 2 (mild): Secondary | ICD-10-CM | POA: Diagnosis not present

## 2018-07-14 ENCOUNTER — Encounter: Payer: Self-pay | Admitting: Family Medicine

## 2018-07-14 ENCOUNTER — Ambulatory Visit (INDEPENDENT_AMBULATORY_CARE_PROVIDER_SITE_OTHER): Payer: Medicare Other | Admitting: Family Medicine

## 2018-07-14 VITALS — BP 97/55 | HR 87 | Temp 97.8°F | Ht 66.0 in | Wt 201.6 lb

## 2018-07-14 DIAGNOSIS — E782 Mixed hyperlipidemia: Secondary | ICD-10-CM

## 2018-07-14 DIAGNOSIS — F411 Generalized anxiety disorder: Secondary | ICD-10-CM

## 2018-07-14 DIAGNOSIS — E559 Vitamin D deficiency, unspecified: Secondary | ICD-10-CM

## 2018-07-14 DIAGNOSIS — N5203 Combined arterial insufficiency and corporo-venous occlusive erectile dysfunction: Secondary | ICD-10-CM

## 2018-07-14 DIAGNOSIS — Z125 Encounter for screening for malignant neoplasm of prostate: Secondary | ICD-10-CM

## 2018-07-14 DIAGNOSIS — I10 Essential (primary) hypertension: Secondary | ICD-10-CM

## 2018-07-14 DIAGNOSIS — K219 Gastro-esophageal reflux disease without esophagitis: Secondary | ICD-10-CM | POA: Diagnosis not present

## 2018-07-14 MED ORDER — TERAZOSIN HCL 2 MG PO CAPS: 2.0000 mg | ORAL_CAPSULE | Freq: Every day | ORAL | 1 refills | Status: DC

## 2018-07-14 MED ORDER — PANTOPRAZOLE SODIUM 40 MG PO TBEC: 40.0000 mg | DELAYED_RELEASE_TABLET | Freq: Every day | ORAL | 1 refills | Status: DC

## 2018-07-14 MED ORDER — SILDENAFIL CITRATE 20 MG PO TABS: 20.0000 mg | ORAL_TABLET | ORAL | 0 refills | Status: DC | PRN

## 2018-07-14 MED ORDER — FENOFIBRATE 48 MG PO TABS: 48.0000 mg | ORAL_TABLET | Freq: Every day | ORAL | 1 refills | Status: DC

## 2018-07-14 MED ORDER — CHLORTHALIDONE 25 MG PO TABS: 12.5000 mg | ORAL_TABLET | Freq: Every day | ORAL | 1 refills | Status: DC

## 2018-07-14 MED ORDER — CITALOPRAM HYDROBROMIDE 20 MG PO TABS: 20.0000 mg | ORAL_TABLET | Freq: Every day | ORAL | 1 refills | Status: DC

## 2018-07-16 ENCOUNTER — Other Ambulatory Visit: Payer: Medicare Other

## 2018-07-16 DIAGNOSIS — Z23 Encounter for immunization: Secondary | ICD-10-CM | POA: Diagnosis not present

## 2018-07-16 DIAGNOSIS — Z125 Encounter for screening for malignant neoplasm of prostate: Secondary | ICD-10-CM

## 2018-07-16 DIAGNOSIS — E559 Vitamin D deficiency, unspecified: Secondary | ICD-10-CM

## 2018-07-16 DIAGNOSIS — E785 Hyperlipidemia, unspecified: Secondary | ICD-10-CM | POA: Diagnosis not present

## 2018-07-16 DIAGNOSIS — R5383 Other fatigue: Secondary | ICD-10-CM | POA: Diagnosis not present

## 2018-07-16 LAB — URINALYSIS
BILIRUBIN UA: NEGATIVE
Glucose, UA: NEGATIVE
Ketones, UA: NEGATIVE
Leukocytes, UA: NEGATIVE
Nitrite, UA: NEGATIVE
PROTEIN UA: NEGATIVE
RBC, UA: NEGATIVE
Specific Gravity, UA: 1.015 (ref 1.005–1.030)
Urobilinogen, Ur: 0.2 mg/dL (ref 0.2–1.0)
pH, UA: 6 (ref 5.0–7.5)

## 2018-07-17 LAB — CBC WITH DIFFERENTIAL/PLATELET
BASOS: 1 %
Basophils Absolute: 0 10*3/uL (ref 0.0–0.2)
EOS (ABSOLUTE): 0.1 10*3/uL (ref 0.0–0.4)
EOS: 3 %
HEMATOCRIT: 44.2 % (ref 37.5–51.0)
HEMOGLOBIN: 15.1 g/dL (ref 13.0–17.7)
IMMATURE GRANS (ABS): 0 10*3/uL (ref 0.0–0.1)
IMMATURE GRANULOCYTES: 0 %
LYMPHS: 27 %
Lymphocytes Absolute: 1.4 10*3/uL (ref 0.7–3.1)
MCH: 30.8 pg (ref 26.6–33.0)
MCHC: 34.2 g/dL (ref 31.5–35.7)
MCV: 90 fL (ref 79–97)
MONOCYTES: 7 %
Monocytes Absolute: 0.4 10*3/uL (ref 0.1–0.9)
Neutrophils Absolute: 3.2 10*3/uL (ref 1.4–7.0)
Neutrophils: 62 %
Platelets: 230 10*3/uL (ref 150–450)
RBC: 4.91 x10E6/uL (ref 4.14–5.80)
RDW: 12.9 % (ref 12.3–15.4)
WBC: 5.1 10*3/uL (ref 3.4–10.8)

## 2018-07-17 LAB — CMP14+EGFR
ALBUMIN: 4.3 g/dL (ref 3.5–4.8)
ALT: 16 IU/L (ref 0–44)
AST: 39 IU/L (ref 0–40)
Albumin/Globulin Ratio: 2 (ref 1.2–2.2)
Alkaline Phosphatase: 55 IU/L (ref 39–117)
BUN/Creatinine Ratio: 10 (ref 10–24)
BUN: 12 mg/dL (ref 8–27)
Bilirubin Total: 0.6 mg/dL (ref 0.0–1.2)
CALCIUM: 9.3 mg/dL (ref 8.6–10.2)
CO2: 27 mmol/L (ref 20–29)
CREATININE: 1.26 mg/dL (ref 0.76–1.27)
Chloride: 101 mmol/L (ref 96–106)
GFR, EST AFRICAN AMERICAN: 66 mL/min/{1.73_m2} (ref >59)
GFR, EST NON AFRICAN AMERICAN: 57 mL/min/{1.73_m2} — AB (ref >59)
GLOBULIN, TOTAL: 2.2 g/dL (ref 1.5–4.5)
Glucose: 84 mg/dL (ref 65–99)
Potassium: 4.3 mmol/L (ref 3.5–5.2)
SODIUM: 142 mmol/L (ref 134–144)
TOTAL PROTEIN: 6.5 g/dL (ref 6.0–8.5)

## 2018-07-17 LAB — LIPID PANEL
CHOL/HDL RATIO: 4.5 ratio (ref 0.0–5.0)
Cholesterol, Total: 213 mg/dL — ABNORMAL HIGH (ref 100–199)
HDL: 47 mg/dL (ref >39)
LDL CALC: 123 mg/dL — AB (ref 0–99)
TRIGLYCERIDES: 215 mg/dL — AB (ref 0–149)
VLDL Cholesterol Cal: 43 mg/dL — ABNORMAL HIGH (ref 5–40)

## 2018-07-17 LAB — VITAMIN D 25 HYDROXY (VIT D DEFICIENCY, FRACTURES): VIT D 25 HYDROXY: 41.7 ng/mL (ref 30.0–100.0)

## 2018-07-20 ENCOUNTER — Encounter: Payer: Self-pay | Admitting: Family Medicine

## 2018-07-20 MED ORDER — CLONAZEPAM 0.5 MG PO TABS: 0.5000 mg | ORAL_TABLET | Freq: Two times a day (BID) | ORAL | 0 refills | Status: DC

## 2018-07-20 NOTE — Progress Notes (Signed)
Subjective:  Patient ID: Marvin White, male    DOB: Jan 21, 1946  Age: 72 y.o. MRN: [id number]  CC: New Patient (Initial Visit)   HPI Marvin White presents for new patient evaluation.  Patient in for follow-up of GERD. Currently asymptomatic taking  PPI daily. There is no chest pain or heartburn. No hematemesis and no melena. No dysphagia or choking. Onset is remote. Progression is stable. Complicating factors, none.  Follow-up of hypertension. Patient has no history of headache chest pain or shortness of breath or recent cough. Patient also denies symptoms of TIA such as numbness weakness lateralizing. Patient checks  blood pressure at home and has not had any elevated readings recently. Patient denies side effects from his medication. States taking it regularly.  Patient takes Terazosin and for this.  He believes it is for blood pressure but does have some urinary hesitancy.  It is not clear whether BPH was a consideration when it was originally prescribed. Patient also deals with erectile dysfunction.  Sildenafil helps.  He tends to take the full 100 mg.  Just due for refills on that.  Denies side effects including headache.  Patient in for follow-up of elevated cholesterol. Doing well without complaints on current medication. Denies side effects of fenofibrate including myalgia and arthralgia and nausea. Also in today for liver function testing. Currently no chest pain, shortness of breath or other cardiovascular related symptoms noted.  Depression screen PHQ 2/9 07/14/2018  Decreased Interest 0  Down, Depressed, Hopeless 0  PHQ - 2 Score 0    History Marvin White has a past medical history of Chickenpox, Depression, High blood pressure, and High cholesterol.   He has a past surgical history that includes Hernia repair and Tonsillectomy.   His family history is not on file.He has an unknown smoking status. He has never used smokeless tobacco. He reports that he drank alcohol. He reports  that he does not use drugs.    ROS Review of Systems  Constitutional: Negative.   HENT: Negative.   Eyes: Negative for visual disturbance.  Respiratory: Negative for cough and shortness of breath.   Cardiovascular: Negative for chest pain and leg swelling.  Gastrointestinal: Negative for abdominal pain, diarrhea, nausea and vomiting.  Genitourinary: Negative for difficulty urinating.  Musculoskeletal: Negative for arthralgias and myalgias.  Skin: Negative for rash.  Neurological: Negative for headaches.  Psychiatric/Behavioral: Negative for sleep disturbance.    Objective:  BP (!) 97/55   Pulse 87   Temp 97.8 F (36.6 C) (Oral)   Ht _0  (1.676 m)   Wt 201 lb 9.6 oz (91.4 kg)   BMI 32.54 kg/m   BP Readings from Last 3 Encounters:  07/14/18 (!) 97/55  08/21/17 110/68  04/17/17 103/72    Wt Readings from Last 3 Encounters:  07/14/18 201 lb 9.6 oz (91.4 kg)  08/21/17 214 lb (97.1 kg)  04/17/17 210 lb (95.3 kg)     Physical Exam  Constitutional: He is oriented to person, place, and time. He appears well-developed and well-nourished. No distress.  HENT:  Head: Normocephalic and atraumatic.  Right Ear: External ear normal.  Left Ear: External ear normal.  Nose: Nose normal.  Mouth/Throat: Oropharynx is clear and moist.  Eyes: Pupils are equal, round, and reactive to light. Conjunctivae and EOM are normal.  Neck: Normal range of motion. Neck supple.  Cardiovascular: Normal rate, regular rhythm and normal heart sounds.  No murmur heard. Pulmonary/Chest: Effort normal and breath sounds normal. No respiratory distress.  He has no wheezes. He has no rales.  Abdominal: Soft. There is no tenderness.  Musculoskeletal: Normal range of motion.  Neurological: He is alert and oriented to person, place, and time. He has normal reflexes.  Skin: Skin is warm and dry.  Psychiatric: He has a normal mood and affect. His behavior is normal. Judgment and thought content normal.       Assessment & Plan:   Marvin White was seen today for new patient (initial visit).  Diagnoses and all orders for this visit:  Essential hypertension -     CBC with Differential/Platelet -     CMP14+EGFR -     Urinalysis  Mixed hyperlipidemia -     CBC with Differential/Platelet -     CMP14+EGFR -     Lipid panel -     Urinalysis  Gastroesophageal reflux disease without esophagitis -     CBC with Differential/Platelet -     CMP14+EGFR -     Urinalysis  GAD (generalized anxiety disorder) -     CBC with Differential/Platelet -     CMP14+EGFR -     Urinalysis  Combined arterial insufficiency and corporo-venous occlusive erectile dysfunction -     CBC with Differential/Platelet -     CMP14+EGFR -     Urinalysis  Screening for prostate cancer -     PSA, total and free  Vitamin D deficiency -     VITAMIN D 25 Hydroxy (Vit-D Deficiency, Fractures)  Other orders -     terazosin (HYTRIN) 2 MG capsule; Take 1 capsule (2 mg total) by mouth at bedtime. -     sildenafil (REVATIO) 20 MG tablet; Take 1 tablet (20 mg total) by mouth as needed. -     pantoprazole (PROTONIX) 40 MG tablet; Take 1 tablet (40 mg total) by mouth daily. -     fenofibrate (TRICOR) 48 MG tablet; Take 1 tablet (48 mg total) by mouth daily. -     citalopram (CELEXA) 20 MG tablet; Take 1 tablet (20 mg total) by mouth daily. -     chlorthalidone (HYGROTON) 25 MG tablet; Take 0.5 tablets (12.5 mg total) by mouth daily. -     clonazePAM (KLONOPIN) 0.5 MG tablet; Take 1 tablet (0.5 mg total) by mouth 2 (two) times daily.       I have discontinued Vangie Bicker. Needham's chlorthalidone, ranitidine, LORazepam, aspirin, and amoxicillin. I have also changed his terazosin, sildenafil, pantoprazole, fenofibrate, citalopram, chlorthalidone, and clonazePAM. Additionally, I am having him maintain his geriatric multivitamins-minerals, b complex vitamins, and diphenhydrAMINE.  Allergies as of 07/14/2018   No Known  Allergies     Medication List        Accurate as of 07/14/18 11:59 PM. Always use your most recent med list.          b complex vitamins tablet Take 1 tablet by mouth daily.   chlorthalidone 25 MG tablet Commonly known as:  HYGROTON Take 0.5 tablets (12.5 mg total) by mouth daily.   citalopram 20 MG tablet Commonly known as:  CELEXA Take 1 tablet (20 mg total) by mouth daily.   clonazePAM 0.5 MG tablet Commonly known as:  KLONOPIN Take 1 tablet (0.5 mg total) by mouth 2 (two) times daily.   diphenhydrAMINE 50 MG capsule Commonly known as:  BENADRYL Take 50 mg by mouth 2 (two) times daily.   fenofibrate 48 MG tablet Commonly known as:  TRICOR Take 1 tablet (48 mg total) by mouth daily.  geriatric multivitamins-minerals Elix Take 15 mLs by mouth daily.   pantoprazole 40 MG tablet Commonly known as:  PROTONIX Take 1 tablet (40 mg total) by mouth daily.   sildenafil 20 MG tablet Commonly known as:  REVATIO Take 1 tablet (20 mg total) by mouth as needed.   terazosin 2 MG capsule Commonly known as:  HYTRIN Take 1 capsule (2 mg total) by mouth at bedtime.        Follow-up: Return in about 3 months (around 10/14/2018).  Claretta Fraise, M.D.

## 2018-08-07 ENCOUNTER — Telehealth: Payer: Self-pay | Admitting: Family Medicine

## 2018-08-07 NOTE — Telephone Encounter (Signed)
Pharmacy: [facility]   Pt states that the clonazePAM (KLONOPIN) 0.5 MG tablet was sent in for 30 pills which is only 15 day supply he takes 2 daily please advise

## 2018-08-10 NOTE — Telephone Encounter (Signed)
Dr. Livia Snellen will have to correct when he comes back

## 2018-08-10 NOTE — Telephone Encounter (Signed)
Patient is aware you are out of the office this week and is okay to wait until your return for this to be corrected.  On 07/20/18 he was prescribed Clonazepam 0.5 mg, #15, with no refills.  The directions are to take twice daily so he feels he should have been given a greater quantity of medication.  Please advise.

## 2018-08-11 DIAGNOSIS — H35371 Puckering of macula, right eye: Secondary | ICD-10-CM | POA: Diagnosis not present

## 2018-08-11 DIAGNOSIS — H353221 Exudative age-related macular degeneration, left eye, with active choroidal neovascularization: Secondary | ICD-10-CM | POA: Diagnosis not present

## 2018-08-11 DIAGNOSIS — H353121 Nonexudative age-related macular degeneration, left eye, early dry stage: Secondary | ICD-10-CM | POA: Diagnosis not present

## 2018-08-11 DIAGNOSIS — H43811 Vitreous degeneration, right eye: Secondary | ICD-10-CM | POA: Diagnosis not present

## 2018-08-11 DIAGNOSIS — H3562 Retinal hemorrhage, left eye: Secondary | ICD-10-CM | POA: Diagnosis not present

## 2018-08-18 ENCOUNTER — Other Ambulatory Visit: Payer: Self-pay | Admitting: Family Medicine

## 2018-08-18 MED ORDER — CLONAZEPAM 0.5 MG PO TABS: 0.5000 mg | ORAL_TABLET | Freq: Two times a day (BID) | ORAL | 2 refills | Status: DC

## 2018-08-18 NOTE — Telephone Encounter (Signed)
I checked his PMP aware status. It looks good, so I sent in refills of the clonazopam

## 2018-08-18 NOTE — Telephone Encounter (Signed)
Aware. 

## 2018-09-10 ENCOUNTER — Telehealth: Payer: Self-pay | Admitting: Family Medicine

## 2018-09-10 DIAGNOSIS — H353221 Exudative age-related macular degeneration, left eye, with active choroidal neovascularization: Secondary | ICD-10-CM | POA: Diagnosis not present

## 2018-09-10 DIAGNOSIS — H35371 Puckering of macula, right eye: Secondary | ICD-10-CM | POA: Diagnosis not present

## 2018-09-10 DIAGNOSIS — H353121 Nonexudative age-related macular degeneration, left eye, early dry stage: Secondary | ICD-10-CM | POA: Diagnosis not present

## 2018-09-10 DIAGNOSIS — H43811 Vitreous degeneration, right eye: Secondary | ICD-10-CM | POA: Diagnosis not present

## 2018-09-10 DIAGNOSIS — R0683 Snoring: Secondary | ICD-10-CM

## 2018-09-10 NOTE — Telephone Encounter (Signed)
Forward to pcp

## 2018-09-10 NOTE — Telephone Encounter (Signed)
Covering pcp please advise. 

## 2018-09-10 NOTE — Telephone Encounter (Signed)
Pt was seen by dr Zadie Rhine and dr Zadie Rhine is wanting dr Livia Snellen to refer pt to have sleep apena test that could be related to his eye issues. Pt is scheduled to see dr Zadie Rhine 10/15/2018 would like to have before his apt with dr stacks and dr Zadie Rhine, would like to have apt after christmas if at all possible.

## 2018-09-11 NOTE — Telephone Encounter (Signed)
PCP out of office, referral to neurology for sleep apnea eval in

## 2018-09-11 NOTE — Telephone Encounter (Signed)
Aware, per voice mail message,  referral done.

## 2018-09-30 ENCOUNTER — Other Ambulatory Visit: Payer: Self-pay | Admitting: Family Medicine

## 2018-10-07 ENCOUNTER — Ambulatory Visit (INDEPENDENT_AMBULATORY_CARE_PROVIDER_SITE_OTHER): Payer: Medicare Other | Admitting: Neurology

## 2018-10-07 ENCOUNTER — Encounter: Payer: Self-pay | Admitting: Neurology

## 2018-10-07 VITALS — BP 140/81 | HR 108 | Ht 66.0 in | Wt 207.0 lb

## 2018-10-07 DIAGNOSIS — R413 Other amnesia: Secondary | ICD-10-CM | POA: Diagnosis not present

## 2018-10-07 DIAGNOSIS — G4719 Other hypersomnia: Secondary | ICD-10-CM | POA: Diagnosis not present

## 2018-10-07 DIAGNOSIS — J3089 Other allergic rhinitis: Secondary | ICD-10-CM

## 2018-10-07 DIAGNOSIS — M6289 Other specified disorders of muscle: Secondary | ICD-10-CM | POA: Diagnosis not present

## 2018-10-07 DIAGNOSIS — G4752 REM sleep behavior disorder: Secondary | ICD-10-CM | POA: Diagnosis not present

## 2018-10-07 DIAGNOSIS — H353230 Exudative age-related macular degeneration, bilateral, stage unspecified: Secondary | ICD-10-CM

## 2018-10-07 DIAGNOSIS — R292 Abnormal reflex: Secondary | ICD-10-CM | POA: Diagnosis not present

## 2018-10-07 MED ORDER — FEXOFENADINE HCL 60 MG PO TABS: 60.0000 mg | ORAL_TABLET | Freq: Two times a day (BID) | ORAL | 5 refills | Status: DC

## 2018-10-07 NOTE — Progress Notes (Signed)
SLEEP MED[facility]INE CLINIC   Provider:  Larey Seat, M D  Primary Care Physician:  Claretta Fraise, MD   Referring Provider: Claretta Fraise, MD    Chief Complaint  Patient presents with  . New Patient (Initial Visit)    pt with wife, rm 89. pt states in sleep he is restless kicks and moves and has wild dreams. states he snores in sleep, wife states she has to poke him to breathe sometimes. never had a sleep study. pt states that he feels well rested but his wife states he takes naps during the day.     HPI:  Marvin White is a 73 y.o. male patient with macular wet degeneration- he underwent testing with Dr. Deloria Lair, who asked if he snored and if he was sleepy in daytime. His wife, who is blind, has witnessed sleep apnea episodes and nudges her husband almost continuously through the night.  Her husband also acts out dreams and yells, or talks- she can't understand him- his dreams are vivid, violently.He boxes, he kicks, and he has tried to turn his wife " like a piece of furniture " which he used to spray and assemble.  He is unaware of these.   Dr Zadie Rhine is writing a paper on OSA effects on MD.   Chief complaint according to patient : " Dr Zadie Rhine wants me here"  Sleep habits are as follows:  Dinner time is 6- 7 PM for GERD, and dinner is without alcohol, bedtime is 10 Pm.  The bedroom is cool, quiet and dark, they share the bed.  He sleeps on his back and on his right side. He snores louder on his back. 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 Started klonopin, and used high dose benadryl.    Family sleep history: no known sleep disorders.    Social history: He quit alcohol 3 years ago but was an alcohol abuser, he never used any form of tobacco. Caffeine : coffee in AM  and sodas with lunch and dinner daily. Used to work in [employer], Licensed conveyancer.   Married to a retired Marine scientist, now blind. Adopted daughter with 2 children- and son, deceased in 30 -18-2019 of perforated colon /colon cancer at age 64, leaving 2 grandchildren 35 and 11.   Review of Systems: Out of a complete 14 system review, the patient complains of only the following symptoms, and all other reviewed systems are negative. Snoring, REM BD, vision loss.   Epworth score ; 10/ 24 - 16/ 24  , Fatigue severity score 21  , depression score4/ 15.     Social History   Socioeconomic History  . Marital status: Married    Spouse name: Not on file  . Number of children: Not on file  . Years of education: Not on file  . Highest education level: Not on file  Occupational History  . Not on file  Social Needs  . Financial resource strain: Not on file  . Food insecurity:    Worry: Not on file    Inability: Not on file  . Transportation needs:    Medical: Not on file    Non-medical: Not on file  Tobacco Use  . Smoking status: Never Smoker  . Smokeless tobacco: Never Used  Substance and Sexual Activity  . Alcohol  use: Not Currently    Comment: quit 07/27/2016  . Drug use: No  . Sexual activity: Yes  Lifestyle  . Physical activity:    Days per week: Not on file    Minutes per session: Not on file  . Stress: Not on file  Relationships  . Social connections:    Talks on phone: Not on file    Gets together: Not on file    Attends religious service: Not on file    Active member of club or organization: Not on file    Attends meetings of clubs or organizations: Not on file    Relationship status: Not on file  . Intimate partner violence:    Fear of current or ex partner: Not on file    Emotionally abused: Not on file    Physically abused: Not on file    Forced sexual activity: Not on file  Other Topics Concern  . Not on file  Social History Narrative  . Not on file    Family History  Problem Relation Age of Onset  .  Liver cancer Brother   . Stroke Maternal Uncle   . Stroke Paternal Uncle   . Heart attack Maternal Grandfather   . Colon cancer Son   . Kidney cancer Son     Past Medical History:  Diagnosis Date  . Chickenpox   . Depression   . High blood pressure   . High cholesterol     Past Surgical History:  Procedure Laterality Date  . HERNIA REPAIR     abdominal  1998 2008, inguinal  . TONSILLECTOMY      Current Outpatient Medications  Medication Sig Dispense Refill  . b complex vitamins tablet Take 1 tablet by mouth daily.    . chlorthalidone (HYGROTON) 25 MG tablet Take 0.5 tablets (12.5 mg total) by mouth daily. 90 tablet 1  . citalopram (CELEXA) 20 MG tablet Take 1 tablet (20 mg total) by mouth daily. 90 tablet 1  . clonazePAM (KLONOPIN) 0.5 MG tablet Take 1 tablet (0.5 mg total) by mouth 2 (two) times daily. 60 tablet 2  . diphenhydrAMINE (BENADRYL) 50 MG capsule Take 50 mg by mouth 2 (two) times daily.    . fenofibrate (TRICOR) 48 MG tablet Take 1 tablet (48 mg total) by mouth daily. 90 tablet 1  . geriatric multivitamins-minerals (ELDERTONIC/GEVRABON) ELIX Take 15 mLs by mouth daily.    . pantoprazole (PROTONIX) 40 MG tablet Take 1 tablet (40 mg total) by mouth daily. 90 tablet 1  . sildenafil (REVATIO) 20 MG tablet Take 1 tablet (20 mg total) by mouth as needed. 20 tablet 0  . terazosin (HYTRIN) 2 MG capsule Take 1 capsule (2 mg total) by mouth at bedtime. 90 capsule 1   No current facility-administered medications for this visit.     Allergies as of 10/07/2018  . (No Known Allergies)    Vitals: BP 140/81   Pulse (!) 108   Ht 5\' 6"  (1.676 m)   Wt 207 lb (93.9 kg)   BMI 33.41 kg/m  Last Weight:  Wt Readings from Last 1 Encounters:  10/07/18 207 lb (93.9 kg)   WPY:KDXI mass index is 33.41 kg/m.     Last Height:   Ht Readings from Last 1 Encounters:  10/07/18 5\' 6"  (1.676 m)    Physical exam:  General: The patient is awake, alert and appears not in acute  distress. The patient is well groomed. Head: Normocephalic, atraumatic. Neck is supple. Mallampati  3 with lateral pillars.  neck circumference:16. 25 ". Nasal airflow patent- Retrognathia is not seen.  Partial dentures.  Cardiovascular:  Regular rate and rhythm , without  murmurs or carotid bruit, and without distended neck veins. Respiratory: Lungs are clear to auscultation. Skin:  Without evidence of edema, or rash Trunk: BMI is 33.4. The patient's posture is relaxed.  Neurologic exam : The patient is awake and alert, oriented to place and time.   Memory subjective described as intact.   Attention span & concentration ability appears normal.  Speech is fluent,  without  dysarthria, dysphonia or aphasia.  Mood and affect are appropriate.  Cranial nerves: Pupils are equal in size, round and  briskly reactive to direct and consensual light.  Funduscopic exam deferred- he has more paklor in the right eye- dry, wet form in the left eye.  perfect records from Dr Zadie Rhine.   Extraocular movements  in vertical and horizontal planes: coarse saccades in lateral gaze, skipping and endpoint nystagmus. Vertical movements  intact and without nystagmus. Visual fields- blind spot- he reports distorted vision.  Hearing to finger rub intact.   Facial sensation intact to fine touch.  Facial motor strength is symmetric and tongue and uvula move midline. Shoulder shrug was symmetrical.   Motor exam: increased muscle tone, 3 cogwheel clasps on each biceps-  But normal muscle bulk and symmetric strength in all extremities.  Sensory:  Fine touch, pinprick and vibration were normal. Coordination: Rapid alternating movements in the fingers/hands was normal. Finger-to-nose maneuver normal without evidence of ataxia, dysmetria or tremor. Gait and station: Patient walks without assistive device and is able unassisted to climb up to the exam table. Strength within normal limits. Stance is stable and normal. Tandem gait  is unfragmented. Turns with 3 Steps.  Deep tendon reflexes: in the  upper and lower extremities are symmetric and very brisk- especially his patellar response is brisk. Babinski maneuver response is downgoing.  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 It may have it's origin in his  Alcohol abuse history.  I like for him to continue Klonopin, Celexa,  also we may reduce the klonopin dose.   2) short term memory impairment reported. He should not take anticholinergic medication for memory impairment reasons, he takes 4 -6 Benadryl a day, and I like for him to switch to [place].  His occupational exposure to chemicals may play a role here too.   3) witnessed apneas- yes I strongly suspect OSA and he is excessively sleepy. I will order an attendee sleep study to confirm both disorders.   The patient was advised of the nature of the diagnosed disorder , the treatment options and the  risks for general health and wellness arising from not treating the condition.   I spent more than 60 minutes of face to face time with the patient.  Greater than 50% of time was spent in counseling and coordination of care. We have discussed the diagnosis and differential and I answered the patient's questions.    Plan:  Treatment plan and additional workup : Attended sleep study with REM BD montage/ parasomnia montage- and I like good video and  audio- If AHI 30 or higher split the study.    No orders of the defined types were placed in this encounter.    No orders of the defined types were placed in this encounter.    No follow-ups on file.   Larey Seat,  MD 3/47/5830, 7:46 AM  Certified in Neurology by ABPN Certified in [place] by [facility] [address], [place], [place] [address]

## 2018-10-14 ENCOUNTER — Encounter: Payer: Self-pay | Admitting: Family Medicine

## 2018-10-14 ENCOUNTER — Ambulatory Visit (INDEPENDENT_AMBULATORY_CARE_PROVIDER_SITE_OTHER): Payer: Medicare Other | Admitting: Family Medicine

## 2018-10-14 VITALS — BP 95/53 | HR 99 | Temp 98.1°F | Ht 66.0 in | Wt 204.5 lb

## 2018-10-14 DIAGNOSIS — F411 Generalized anxiety disorder: Secondary | ICD-10-CM

## 2018-10-14 DIAGNOSIS — R0683 Snoring: Secondary | ICD-10-CM

## 2018-10-14 DIAGNOSIS — I10 Essential (primary) hypertension: Secondary | ICD-10-CM

## 2018-10-14 DIAGNOSIS — E782 Mixed hyperlipidemia: Secondary | ICD-10-CM | POA: Diagnosis not present

## 2018-10-14 DIAGNOSIS — K219 Gastro-esophageal reflux disease without esophagitis: Secondary | ICD-10-CM | POA: Diagnosis not present

## 2018-10-14 NOTE — Progress Notes (Signed)
Subjective:  Patient ID: Marvin White, male    DOB: 10-01-1945  Age: 73 y.o. MRN: [id number]  CC: Medical Management of Chronic Issues   HPI YORDIN RHODA presents for  follow-up of hypertension. Patient has no history of headache chest pain or shortness of breath or recent cough. Patient also denies symptoms of TIA such as focal numbness or weakness. Patient denies side effects from medication. States taking it regularly. Tellsme his wife has noted that he has been snoring heavily and stops breathing.   GAD 7 : Generalized Anxiety Score 10/14/2018  Nervous, Anxious, on Edge 0  Control/stop worrying 0  Worry too much - different things 0  Trouble relaxing 1  Restless 3  Easily annoyed or irritable 1  Afraid - awful might happen 0  Total GAD 7 Score 5  Anxiety Difficulty Somewhat difficult      History Ranbir has a past medical history of Chickenpox, Depression, High blood pressure, and High cholesterol.   He has a past surgical history that includes Hernia repair and Tonsillectomy.   His family history includes Colon cancer in his son; Heart attack in his maternal grandfather; Kidney cancer in his son; Liver cancer in his brother; Stroke in his maternal uncle and paternal uncle.He reports that he has never smoked. He has never used smokeless tobacco. He reports previous alcohol use. He reports that he does not use drugs.  Current Outpatient Medications on File Prior to Visit  Medication Sig Dispense Refill  . b complex vitamins tablet Take 1 tablet by mouth daily.    . citalopram (CELEXA) 20 MG tablet Take 1 tablet (20 mg total) by mouth daily. 90 tablet 1  . fenofibrate (TRICOR) 48 MG tablet Take 1 tablet (48 mg total) by mouth daily. 90 tablet 1  . fexofenadine (ALLEGRA) 60 MG tablet Take 1 tablet (60 mg total) by mouth 2 (two) times daily. 60 tablet 5  . geriatric multivitamins-minerals (ELDERTONIC/GEVRABON) ELIX Take 15 mLs by mouth daily.    . pantoprazole (PROTONIX)  40 MG tablet Take 1 tablet (40 mg total) by mouth daily. 90 tablet 1  . sildenafil (REVATIO) 20 MG tablet Take 1 tablet (20 mg total) by mouth as needed. 20 tablet 0  . terazosin (HYTRIN) 2 MG capsule Take 1 capsule (2 mg total) by mouth at bedtime. 90 capsule 1   No current facility-administered medications on file prior to visit.     ROS Review of Systems  Constitutional: Negative.   HENT: Negative.   Eyes: Negative for visual disturbance.  Respiratory: Negative for cough and shortness of breath.   Cardiovascular: Negative for chest pain and leg swelling.  Gastrointestinal: Negative for abdominal pain, diarrhea, nausea and vomiting.  Genitourinary: Negative for difficulty urinating.  Musculoskeletal: Negative for arthralgias and myalgias.  Skin: Negative for rash.  Neurological: Negative for headaches.  Psychiatric/Behavioral: Positive for sleep disturbance.    Objective:  BP (!) 95/53   Pulse 99   Temp 98.1 F (36.7 C) (Oral)   Ht 5\' 6"  (1.676 m)   Wt 204 lb 8 oz (92.8 kg)   BMI 33.01 kg/m   BP Readings from Last 3 Encounters:  10/14/18 (!) 95/53  10/07/18 140/81  07/14/18 (!) 97/55    Wt Readings from Last 3 Encounters:  10/14/18 204 lb 8 oz (92.8 kg)  10/07/18 207 lb (93.9 kg)  07/14/18 201 lb 9.6 oz (91.4 kg)     Physical Exam Constitutional:      General:  He is not in acute distress.    Appearance: He is well-developed.  HENT:     Head: Normocephalic and atraumatic.     Right Ear: External ear normal.     Left Ear: External ear normal.     Nose: Nose normal.  Eyes:     Conjunctiva/sclera: Conjunctivae normal.     Pupils: Pupils are equal, round, and reactive to light.  Neck:     Musculoskeletal: Normal range of motion and neck supple.  Cardiovascular:     Rate and Rhythm: Normal rate and regular rhythm.     Heart sounds: Normal heart sounds. No murmur.  Pulmonary:     Effort: Pulmonary effort is normal. No respiratory distress.     Breath sounds:  Normal breath sounds. No wheezing or rales.  Abdominal:     Palpations: Abdomen is soft.     Tenderness: There is no abdominal tenderness.  Musculoskeletal: Normal range of motion.  Skin:    General: Skin is warm and dry.  Neurological:     Mental Status: He is alert and oriented to person, place, and time.     Deep Tendon Reflexes: Reflexes are normal and symmetric.  Psychiatric:        Behavior: Behavior normal.        Thought Content: Thought content normal.        Judgment: Judgment normal.       Assessment & Plan:   Keondre was seen today for medical management of chronic issues.  Diagnoses and all orders for this visit:  Essential hypertension  Mixed hyperlipidemia  GAD (generalized anxiety disorder)  Gastroesophageal reflux disease without esophagitis  Snoring -     Ambulatory referral to Sleep Studies  Other orders -     clonazePAM (KLONOPIN) 0.5 MG tablet; Take 1 tablet (0.5 mg total) by mouth 2 (two) times daily.   Allergies as of 10/14/2018   No Known Allergies     Medication List       Accurate as of October 14, 2018 11:59 PM. Always use your most recent med list.        b complex vitamins tablet Take 1 tablet by mouth daily.   citalopram 20 MG tablet Commonly known as:  CELEXA Take 1 tablet (20 mg total) by mouth daily.   clonazePAM 0.5 MG tablet Commonly known as:  KLONOPIN Take 1 tablet (0.5 mg total) by mouth 2 (two) times daily.   fenofibrate 48 MG tablet Commonly known as:  TRICOR Take 1 tablet (48 mg total) by mouth daily.   fexofenadine 60 MG tablet Commonly known as:  ALLEGRA Take 1 tablet (60 mg total) by mouth 2 (two) times daily.   geriatric multivitamins-minerals Elix Take 15 mLs by mouth daily.   pantoprazole 40 MG tablet Commonly known as:  PROTONIX Take 1 tablet (40 mg total) by mouth daily.   sildenafil 20 MG tablet Commonly known as:  REVATIO Take 1 tablet (20 mg total) by mouth as needed.   terazosin 2 MG  capsule Commonly known as:  HYTRIN Take 1 capsule (2 mg total) by mouth at bedtime.       Meds ordered this encounter  Medications  . clonazePAM (KLONOPIN) 0.5 MG tablet    Sig: Take 1 tablet (0.5 mg total) by mouth 2 (two) times daily.    Dispense:  60 tablet    Refill:  2      Follow-up: Return in about 3 months (around 01/13/2019).  Claretta Fraise,  M.D. 

## 2018-10-15 DIAGNOSIS — H3562 Retinal hemorrhage, left eye: Secondary | ICD-10-CM | POA: Diagnosis not present

## 2018-10-15 DIAGNOSIS — H353122 Nonexudative age-related macular degeneration, left eye, intermediate dry stage: Secondary | ICD-10-CM | POA: Diagnosis not present

## 2018-10-15 DIAGNOSIS — H353221 Exudative age-related macular degeneration, left eye, with active choroidal neovascularization: Secondary | ICD-10-CM | POA: Diagnosis not present

## 2018-10-18 ENCOUNTER — Encounter: Payer: Self-pay | Admitting: Family Medicine

## 2018-10-18 MED ORDER — CLONAZEPAM 0.5 MG PO TABS: 0.5000 mg | ORAL_TABLET | Freq: Two times a day (BID) | ORAL | 2 refills | Status: DC

## 2018-10-22 ENCOUNTER — Telehealth: Payer: Self-pay | Admitting: Family Medicine

## 2018-10-22 NOTE — Telephone Encounter (Signed)
At this time sounds mainly viral, if he develops any more specific symptoms such as colored sputum, productive cough, high fever etc., please call back.  I am forwarding this note to Dr. Livia Snellen also.

## 2018-10-22 NOTE — Telephone Encounter (Signed)
lmtcb

## 2018-10-23 ENCOUNTER — Other Ambulatory Visit: Payer: Self-pay | Admitting: Family Medicine

## 2018-10-23 MED ORDER — AMOXICILLIN-POT CLAVULANATE 875-125 MG PO TABS: 1.0000 | ORAL_TABLET | Freq: Two times a day (BID) | ORAL | 0 refills | Status: DC

## 2018-10-23 NOTE — Telephone Encounter (Signed)
Patient notified and verbalized understanding. 

## 2018-10-23 NOTE — Telephone Encounter (Signed)
Patient called office this AM stating that his cough has become productive and the color is a light gray and thick. Wants to know if meds can be sent in. Please review and advise

## 2018-10-23 NOTE — Telephone Encounter (Signed)
See previous note

## 2018-10-23 NOTE — Telephone Encounter (Signed)
I sent in the requested prescription 

## 2018-11-03 ENCOUNTER — Institutional Professional Consult (permissible substitution): Payer: Medicare Other | Admitting: Neurology

## 2018-11-03 ENCOUNTER — Ambulatory Visit: Payer: Medicare Other | Admitting: Neurology

## 2018-11-04 ENCOUNTER — Telehealth: Payer: Self-pay

## 2018-11-04 NOTE — Telephone Encounter (Signed)
Patient came for sleep study on2-07-2019 and decided to leave and not have study done.Tech notes is under media.

## 2018-11-05 ENCOUNTER — Other Ambulatory Visit: Payer: Self-pay | Admitting: Family Medicine

## 2018-11-23 DIAGNOSIS — H43812 Vitreous degeneration, left eye: Secondary | ICD-10-CM | POA: Diagnosis not present

## 2018-11-23 DIAGNOSIS — H353221 Exudative age-related macular degeneration, left eye, with active choroidal neovascularization: Secondary | ICD-10-CM | POA: Diagnosis not present

## 2018-11-23 DIAGNOSIS — H3562 Retinal hemorrhage, left eye: Secondary | ICD-10-CM | POA: Diagnosis not present

## 2018-11-25 ENCOUNTER — Ambulatory Visit (INDEPENDENT_AMBULATORY_CARE_PROVIDER_SITE_OTHER): Payer: Medicare Other | Admitting: Neurology

## 2018-11-25 DIAGNOSIS — G4733 Obstructive sleep apnea (adult) (pediatric): Secondary | ICD-10-CM | POA: Diagnosis not present

## 2018-11-25 DIAGNOSIS — R0683 Snoring: Secondary | ICD-10-CM

## 2018-11-25 DIAGNOSIS — G479 Sleep disorder, unspecified: Secondary | ICD-10-CM

## 2018-11-26 DIAGNOSIS — H353211 Exudative age-related macular degeneration, right eye, with active choroidal neovascularization: Secondary | ICD-10-CM | POA: Diagnosis not present

## 2018-11-26 DIAGNOSIS — H35371 Puckering of macula, right eye: Secondary | ICD-10-CM | POA: Diagnosis not present

## 2018-11-26 DIAGNOSIS — H43811 Vitreous degeneration, right eye: Secondary | ICD-10-CM | POA: Diagnosis not present

## 2018-11-26 DIAGNOSIS — H353221 Exudative age-related macular degeneration, left eye, with active choroidal neovascularization: Secondary | ICD-10-CM | POA: Diagnosis not present

## 2018-11-27 DIAGNOSIS — G479 Sleep disorder, unspecified: Secondary | ICD-10-CM | POA: Insufficient documentation

## 2018-11-27 DIAGNOSIS — R0683 Snoring: Secondary | ICD-10-CM | POA: Insufficient documentation

## 2018-11-27 NOTE — Procedures (Signed)
Marvin White, Marvin White DOB:      06/06/1946      MR#:    [id number]     DATE OF RECORDING: 11/25/2018 REFERRING M.D.:  Deloria Lair, MD ; Claretta Fraise, MD Study Performed:  Split-Night Titration Study HISTORY:   Marvin White is a 73 y.o. male patient with macular wet degeneration- he underwent testing with Dr. Deloria Lair, who asked if he snored and if he was sleepy in daytime. He was concerned about retinal changes.  His wife, who is blind, has witnessed sleep apnea episodes and nudges her husband almost continuously through the night.  Her husband also acts out dreams and yells, or talks- she can't understand him- his dreams are vivid, violently. He boxes, he kicks, and he has tried to turn his wife  "like a piece of furniture " which he used to spray and assemble. He is unaware of these activities. Possible REM BD.  The patient endorsed the Epworth Sleepiness Scale at 10/24 points   The patient's weight 207 pounds with a height of 66 (inches), resulting in a BMI of 33.3 kg/m2. The patient's neck circumference measured 16.2 inches.  CURRENT MEDICATIONS: B complex vitamins, Hygroton, Celexa, Klonopin, Benadryl, Tricor, Multivitamins, Protonix, Revatio, Hytrin   PROCEDURE:  This is a multichannel digital polysomnogram utilizing the Somnostar 11.2 system.  Electrodes and sensors were applied and monitored per AASM Specifications.   EEG, EOG, Chin and Limb EMG, were sampled at 200 Hz.  ECG, Snore and Nasal Pressure, Thermal Airflow, Respiratory Effort, CPAP Flow and Pressure, Oximetry was sampled at 50 Hz. Digital video and audio were recorded.      BASELINE STUDY WITHOUT CPAP RESULTS: Lights Out was at 21:56 and Lights On at 04:59.  Total recording time (TRT) was 186.5, with a total sleep time (TST) of 136.5 minutes.   The patient's sleep latency was 52.5 minutes.  REM latency was 0 minutes.  The sleep efficiency was 73.2 %.    SLEEP ARCHITECTURE: WASO (Wake after sleep onset) was 9 minutes, Stage N1  was 15 minutes, Stage N2 was 121.5 minutes, Stage N3 was 0 minutes and Stage R (REM sleep) was 0 minutes.  The percentages were Stage N1 11.%, Stage N2 89.%, Stage N3 0% and Stage R (REM sleep) 0%.  RESPIRATORY ANALYSIS:  There were a total of 135 respiratory events:  65 obstructive apneas, 0 central apneas and 2 mixed apneas with 68 hypopneas. The patient also had few respiratory event related arousals (RERAs). Loud Snoring was noted, in left lateral position.   The total APNEA/HYPOPNEA INDEX (AHI) was 59.3 /hour.  0 events occurred in REM sleep. The REM AHI was 0.0 /hour versus a non-REM AHI of 59.3 /hour. The patient spent 153.5 minutes sleep time in the supine position 187 minutes in non-supine. The supine AHI was 51.1 /hour versus a non-supine AHI of 71.4 /hour.  OXYGEN SATURATION & C02: The wake baseline 02 saturation was 92%, with the lowest being 82%. Time spent below 89% saturation equaled 93 minutes.  AROUSALS: The arousals were noted as: 30 were spontaneous, 0 were associated with PLMs, and 68 were associated with respiratory events. The patient had a total of 0 Periodic Limb Movements.    Audio and video analysis did not show any abnormal or unusual movements, behaviors, phonations or vocalizations The patient took one bathroom break in each part of the SPLIT PSG, and one during the break between studies. Loud Snoring was noted. EKG was in keeping with normal  sinus rhythm (NSR)  TITRATION STUDY WITH CPAP RESULTS: An ESON nasal mask in medium size was used.  CPAP was initiated at 5 cmH20 with heated humidity per AASM split night standards and pressure was advanced to 10 cmH20 because of hypopneas, apneas and desaturations.  At a PAP pressure of 10 cmH20, there was a reduction of the AHI to 31.1 /hour. At 7 cm water pressure there was an AHI of 1.0/h but snoring break through was also noted. The patient slept under 7 cm water for 118 minutes. Slept partially in supine, partially in REM sleep.  Central apneas emerged at higher pressures, such as 9 and 10 cm water.   Total recording time (TRT) was 237 minutes, with a total sleep time (TST) of 203.5 minutes. The patient's sleep latency was 14.5 minutes. REM latency was 6.5 minutes.  The sleep efficiency was 85.9 %.   SLEEP ARCHITECTURE: Wake after sleep was 18 minutes, Stage N1 15.5 minutes, Stage N2 101.5 minutes, Stage N3 0 minutes and Stage R (REM sleep) 86.5 minutes. The percentages were: Stage N1 7.6%, Stage N2 49.9%, Stage N3 0% and Stage R (REM sleep) 42.5%.  RESPIRATORY ANALYSIS:  There were a total of 34 respiratory events: 25 obstructive apneas, 6 central apneas and 3 hypopneas with few respiratory event related arousals (RERAs).     The total APNEA/HYPOPNEA INDEX (AHI) was 10.0 /hour.  29 events occurred in REM sleep and 5 events in NREM. The REM AHI was 20.1 /hour versus a non-REM AHI of 2.6 /hour. REM sleep was achieved on a pressure of 6 cm/h2o (AHI was 28.8/h.) The patient spent 36% of total sleep time in the supine position. The supine AHI was 8.3 /hour, versus a non-supine AHI of 11.0/hour.  OXYGEN SATURATION & C02: The wake baseline 02 saturation was 94%, with the lowest being 81%. Time spent below 89% saturation equaled 10 minutes.  AROUSALS: The arousals were noted as: 15 were spontaneous, 1 was associated with PLMs, and only 1 was associated with respiratory events.   The patient had a total of 5 Periodic Limb Movements. The Periodic Limb Movement (PLM) index was 1.5 /hour and the PLM Arousal index was 0.3 /hour.  POLYSOMNOGRAPHY IMPRESSION :   1. Severe Obstructive Sleep Apnea (OSA) at AHI 59.3/h without positional accentuation, but loud snoring and prolonged hypoxemia of 93 minutes were evident. REM void first part of this study did not allow REM dependence to be diagnosed.  2. REM sleep rebounded under CPAP titration and AHI was alleviated to 1.0 /h under only 7 cm water pressure CPAP using a nasal ESON medium mask. REM  dependent apnea was now evident.  3. Central Sleep Apnea emerged at any CPAP pressure higher than 7 cm water.   RECOMMENDATIONS:  Autotitration capable CPAP device, heated humidity, pressure window from 5 through 10 cm water with 2 cm EPR. Medium ESON mask or mask of patient's choice.    A follow up appointment will be scheduled in the Sleep Clinic at [facility].     I certify that I have reviewed the entire raw data recording prior to the issuance of this report in accordance with the Standards of Accreditation of the American Academy of Sleep Medicine (AASM)    Larey Seat, M.D.   11-27-2018 Diplomat, American Board of Psychiatry and Neurology  Diplomat, [place] of Sleep Medicine Medical Director, [place] [facility]

## 2018-11-30 ENCOUNTER — Telehealth: Payer: Self-pay | Admitting: Neurology

## 2018-11-30 NOTE — Telephone Encounter (Signed)
-----   Message from Larey Seat, MD sent at 11/27/2018 11:43 AM EST ----- POLYSOMNOGRAPHY IMPRESSION :   1. Severe Obstructive Sleep Apnea (OSA) at AHI 59.3/h without  positional accentuation, but loud snoring and prolonged hypoxemia  of 93 minutes were evident. REM void first part of this study did  not allow REM dependence to be diagnosed.  2. REM sleep rebounded under CPAP titration and AHI was  alleviated to 1.0 /h under only 7 cm water pressure CPAP using a  nasal ESON medium mask. REM dependent apnea was now evident.  3. Central Sleep Apnea emerged at any CPAP pressure higher than 7  cm water.   RECOMMENDATIONS: Autotitration capable CPAP device, heated  humidity, pressure window from 5 through 10 cm water with 2 cm  EPR. Medium ESON mask or mask of patient's choice.

## 2018-11-30 NOTE — Telephone Encounter (Signed)
Called patient to discuss sleep study results. No answer at this time. LVM for the patient to call back.   

## 2018-11-30 NOTE — Telephone Encounter (Signed)
Pt returned call and I was able to go over the sleep study results with him. I advised pt that Dr. Brett Fairy reviewed their sleep study results and found that pt has sleep apnea. Dr. Brett Fairy recommends that pt starts auto CPAP 5-10 cm water pressure. I reviewed PAP compliance expectations with the pt. Pt is agreeable to starting a CPAP. I advised pt that an order will be sent to a DME,  [facility], and [facility] will call the pt within about one week after they file with the pt's insurance. [facility] will show the pt how to use the machine, fit for masks, and troubleshoot the CPAP if needed. A follow up appt was made for insurance purposes with Debbora Presto on June 1,2020 at 1:00 pm. Pt verbalized understanding to arrive 15 minutes early and bring their CPAP. A letter with all of this information in it will be mailed to the pt as a reminder. I verified with the pt that the address we have on file is correct. Pt verbalized understanding of results. Pt had no questions at this time but was encouraged to call back if questions arise. I have sent the order to [facility] and have received confirmation that they have received the order.

## 2018-12-04 NOTE — Telephone Encounter (Signed)
I also spoke with patient and advised him that order for CPAP was sent to [facility]. He was very appreciative and stated this would be closer to him and work out much better for him.

## 2018-12-04 NOTE — Telephone Encounter (Signed)
Pt is wanting order for CPAP sent [facility] in [place] instead of [facility] in [place]

## 2018-12-04 NOTE — Telephone Encounter (Signed)
Pt has called to request a copy of the sleep study be faxed over to [place] in [place] @ [phone]

## 2018-12-04 NOTE — Telephone Encounter (Signed)
I printed the CPAP order, pt demographics/insurance information, office note, sleep study results and faxed all to [place] per patient request @ [phone]. Received a receipt of confirmation.

## 2018-12-29 DIAGNOSIS — H43812 Vitreous degeneration, left eye: Secondary | ICD-10-CM | POA: Diagnosis not present

## 2018-12-29 DIAGNOSIS — H353221 Exudative age-related macular degeneration, left eye, with active choroidal neovascularization: Secondary | ICD-10-CM | POA: Diagnosis not present

## 2018-12-29 DIAGNOSIS — H3562 Retinal hemorrhage, left eye: Secondary | ICD-10-CM | POA: Diagnosis not present

## 2019-01-06 DIAGNOSIS — H353211 Exudative age-related macular degeneration, right eye, with active choroidal neovascularization: Secondary | ICD-10-CM | POA: Diagnosis not present

## 2019-01-06 DIAGNOSIS — H43811 Vitreous degeneration, right eye: Secondary | ICD-10-CM | POA: Diagnosis not present

## 2019-01-06 DIAGNOSIS — H35371 Puckering of macula, right eye: Secondary | ICD-10-CM | POA: Diagnosis not present

## 2019-01-09 ENCOUNTER — Other Ambulatory Visit: Payer: Self-pay | Admitting: Family Medicine

## 2019-01-12 ENCOUNTER — Other Ambulatory Visit: Payer: Self-pay | Admitting: Family Medicine

## 2019-01-12 ENCOUNTER — Ambulatory Visit: Payer: Medicare Other | Admitting: Family Medicine

## 2019-01-15 ENCOUNTER — Other Ambulatory Visit: Payer: Self-pay

## 2019-01-15 ENCOUNTER — Encounter: Payer: Self-pay | Admitting: Family Medicine

## 2019-01-15 ENCOUNTER — Ambulatory Visit (INDEPENDENT_AMBULATORY_CARE_PROVIDER_SITE_OTHER): Payer: Medicare Other | Admitting: Family Medicine

## 2019-01-15 DIAGNOSIS — I10 Essential (primary) hypertension: Secondary | ICD-10-CM

## 2019-01-15 DIAGNOSIS — F411 Generalized anxiety disorder: Secondary | ICD-10-CM

## 2019-01-15 DIAGNOSIS — G4733 Obstructive sleep apnea (adult) (pediatric): Secondary | ICD-10-CM

## 2019-01-15 MED ORDER — FENOFIBRATE 48 MG PO TABS: 48.0000 mg | ORAL_TABLET | Freq: Every day | ORAL | 1 refills | Status: DC

## 2019-01-15 MED ORDER — CITALOPRAM HYDROBROMIDE 20 MG PO TABS: 20.0000 mg | ORAL_TABLET | Freq: Every day | ORAL | 1 refills | Status: DC

## 2019-01-15 MED ORDER — PANTOPRAZOLE SODIUM 40 MG PO TBEC: 40.0000 mg | DELAYED_RELEASE_TABLET | Freq: Every day | ORAL | 1 refills | Status: DC

## 2019-01-15 MED ORDER — SILDENAFIL CITRATE 20 MG PO TABS: 20.0000 mg | ORAL_TABLET | ORAL | 5 refills | Status: DC | PRN

## 2019-01-15 NOTE — Progress Notes (Signed)
Subjective:    Patient ID: Marvin White, male    DOB: 1946/07/04, 73 y.o.   MRN: [id number]   HPI: Marvin White is a 73 y.o. male presenting for urinating more frequently. Drinking carbonated beverages instead of water.   Now using CPAP at night. Using clonazepam to help with adjustment. Restlessness has improved.  See GAD score below.  Confirmed today to be unchanged.   Follow-up of hypertension. Patient has no history of headache chest pain or shortness of breath or recent cough. Patient also denies symptoms of TIA such as numbness weakness lateralizing. Patient checks  blood pressure at home and has not had any elevated readings recently. Patient denies side effects from his medication. States taking it regularly.   GAD 7 : Generalized Anxiety Score 10/14/2018  Nervous, Anxious, on Edge 0  Control/stop worrying 0  Worry too much - different things 0  Trouble relaxing 1  Restless 3  Easily annoyed or irritable 1  Afraid - awful might happen 0  Total GAD 7 Score 5  Anxiety Difficulty Somewhat difficult     Depression screen [facility] 2/9 10/14/2018 07/14/2018  Decreased Interest 1 0  Down, Depressed, Hopeless 1 0  PHQ - 2 Score 2 0  Altered sleeping 2 -  Tired, decreased energy 0 -  Change in appetite 1 -  Feeling bad or failure about yourself  0 -  Trouble concentrating 1 -  Moving slowly or fidgety/restless 0 -  Suicidal thoughts 0 -  PHQ-9 Score 6 -     Relevant past medical, surgical, family and social history reviewed and updated as indicated.  Interim medical history since our last visit reviewed. Allergies and medications reviewed and updated.  ROS:  Review of Systems  Constitutional: Negative.   HENT: Negative.   Eyes: Negative for visual disturbance.  Respiratory: Negative for cough and shortness of breath.   Cardiovascular: Negative for chest pain and leg swelling.  Gastrointestinal: Negative for abdominal pain, diarrhea, nausea and vomiting.   Genitourinary: Negative for difficulty urinating.  Musculoskeletal: Negative for arthralgias and myalgias.  Skin: Negative for rash.  Neurological: Negative for headaches.  Psychiatric/Behavioral: Negative for sleep disturbance. The patient is nervous/anxious.      Social History   Tobacco Use  Smoking Status Never Smoker  Smokeless Tobacco Never Used       Objective:     Wt Readings from Last 3 Encounters:  10/14/18 204 lb 8 oz (92.8 kg)  10/07/18 207 lb (93.9 kg)  07/14/18 201 lb 9.6 oz (91.4 kg)     Exam deferred. Pt. Harboring due to COVID 19. Phone visit performed.   Assessment & Plan:   1. Essential hypertension   2. GAD (generalized anxiety disorder)   3. Obstructive sleep apnea syndrome     Meds ordered this encounter  Medications  . sildenafil (REVATIO) 20 MG tablet    Sig: Take 1 tablet (20 mg total) by mouth as needed.    Dispense:  20 tablet    Refill:  5  . citalopram (CELEXA) 20 MG tablet    Sig: Take 1 tablet (20 mg total) by mouth daily.    Dispense:  90 tablet    Refill:  1  . pantoprazole (PROTONIX) 40 MG tablet    Sig: Take 1 tablet (40 mg total) by mouth daily.    Dispense:  90 tablet    Refill:  1  . fenofibrate (TRICOR) 48 MG tablet    Sig: Take  1 tablet (48 mg total) by mouth daily.    Dispense:  90 tablet    Refill:  1  . clonazePAM (KLONOPIN) 0.5 MG tablet    Sig: Take 1 tablet (0.5 mg total) by mouth 2 (two) times daily.    Dispense:  60 tablet    Refill:  2    This prescription was filled on 01/12/2019. Any refills authorized will be placed on file.    No orders of the defined types were placed in this encounter.     Diagnoses and all orders for this visit:  Essential hypertension  GAD (generalized anxiety disorder)  Obstructive sleep apnea syndrome  Other orders -     sildenafil (REVATIO) 20 MG tablet; Take 1 tablet (20 mg total) by mouth as needed. -     citalopram (CELEXA) 20 MG tablet; Take 1 tablet (20 mg total)  by mouth daily. -     pantoprazole (PROTONIX) 40 MG tablet; Take 1 tablet (40 mg total) by mouth daily. -     fenofibrate (TRICOR) 48 MG tablet; Take 1 tablet (48 mg total) by mouth daily. -     clonazePAM (KLONOPIN) 0.5 MG tablet; Take 1 tablet (0.5 mg total) by mouth 2 (two) times daily.    Virtual Visit via telephone Note  I discussed the limitations, risks, security and privacy concerns of performing an evaluation and management service by telephone and the availability of in person appointments. The patient was identified with two identifiers. Pt.expressed understanding and agreed to proceed. Pt. Is at home. Dr. Livia Snellen is in his office.  Follow Up Instructions:   I discussed the assessment and treatment plan with the patient. The patient was provided an opportunity to ask questions and all were answered. The patient agreed with the plan and demonstrated an understanding of the instructions.   The patient was advised to call back or seek an in-person evaluation if the symptoms worsen or if the condition fails to improve as anticipated.  Visit started: 3:50 Call ended:  4:10 Total minutes including chart review and phone contact time: 25   Follow up plan: Return in about 3 months (around 04/16/2019).  Claretta Fraise, MD [place]

## 2019-01-19 ENCOUNTER — Encounter: Payer: Self-pay | Admitting: Family Medicine

## 2019-01-19 MED ORDER — CLONAZEPAM 0.5 MG PO TABS: 0.5000 mg | ORAL_TABLET | Freq: Two times a day (BID) | ORAL | 2 refills | Status: DC

## 2019-02-04 DIAGNOSIS — H353221 Exudative age-related macular degeneration, left eye, with active choroidal neovascularization: Secondary | ICD-10-CM | POA: Diagnosis not present

## 2019-02-04 DIAGNOSIS — H43812 Vitreous degeneration, left eye: Secondary | ICD-10-CM | POA: Diagnosis not present

## 2019-02-16 ENCOUNTER — Telehealth: Payer: Self-pay

## 2019-02-16 NOTE — Telephone Encounter (Signed)
Spoke with the patient and they have given verbal consent to file their insurance and to do a [email or web address] visit. Mobile number and carrier have been confirmed and sent.  E-mail: [email or web address] Text: [phone] ([employer])

## 2019-02-18 DIAGNOSIS — H353211 Exudative age-related macular degeneration, right eye, with active choroidal neovascularization: Secondary | ICD-10-CM | POA: Diagnosis not present

## 2019-02-18 DIAGNOSIS — H353121 Nonexudative age-related macular degeneration, left eye, early dry stage: Secondary | ICD-10-CM | POA: Diagnosis not present

## 2019-02-18 DIAGNOSIS — H43811 Vitreous degeneration, right eye: Secondary | ICD-10-CM | POA: Diagnosis not present

## 2019-02-18 DIAGNOSIS — H35371 Puckering of macula, right eye: Secondary | ICD-10-CM | POA: Diagnosis not present

## 2019-02-22 ENCOUNTER — Other Ambulatory Visit: Payer: Self-pay

## 2019-02-22 ENCOUNTER — Ambulatory Visit (INDEPENDENT_AMBULATORY_CARE_PROVIDER_SITE_OTHER): Payer: Medicare Other | Admitting: Family Medicine

## 2019-02-22 DIAGNOSIS — G4733 Obstructive sleep apnea (adult) (pediatric): Secondary | ICD-10-CM | POA: Diagnosis not present

## 2019-02-22 DIAGNOSIS — Z9989 Dependence on other enabling machines and devices: Secondary | ICD-10-CM
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
## 2019-02-23 ENCOUNTER — Encounter: Payer: Self-pay | Admitting: Family Medicine

## 2019-02-23 DIAGNOSIS — G4733 Obstructive sleep apnea (adult) (pediatric): Secondary | ICD-10-CM | POA: Insufficient documentation

## 2019-03-04 DIAGNOSIS — H3562 Retinal hemorrhage, left eye: Secondary | ICD-10-CM | POA: Diagnosis not present

## 2019-03-04 DIAGNOSIS — H353221 Exudative age-related macular degeneration, left eye, with active choroidal neovascularization: Secondary | ICD-10-CM | POA: Diagnosis not present

## 2019-03-30 DIAGNOSIS — H35371 Puckering of macula, right eye: Secondary | ICD-10-CM | POA: Diagnosis not present

## 2019-03-30 DIAGNOSIS — H353211 Exudative age-related macular degeneration, right eye, with active choroidal neovascularization: Secondary | ICD-10-CM | POA: Diagnosis not present

## 2019-03-30 DIAGNOSIS — H353221 Exudative age-related macular degeneration, left eye, with active choroidal neovascularization: Secondary | ICD-10-CM | POA: Diagnosis not present

## 2019-03-30 DIAGNOSIS — H353121 Nonexudative age-related macular degeneration, left eye, early dry stage: Secondary | ICD-10-CM | POA: Diagnosis not present

## 2019-04-05 ENCOUNTER — Ambulatory Visit (INDEPENDENT_AMBULATORY_CARE_PROVIDER_SITE_OTHER): Payer: Medicare Other | Admitting: Family Medicine

## 2019-04-05 ENCOUNTER — Encounter: Payer: Self-pay | Admitting: Family Medicine

## 2019-04-05 DIAGNOSIS — F411 Generalized anxiety disorder: Secondary | ICD-10-CM | POA: Diagnosis not present

## 2019-04-05 DIAGNOSIS — E782 Mixed hyperlipidemia: Secondary | ICD-10-CM | POA: Diagnosis not present

## 2019-04-05 DIAGNOSIS — I10 Essential (primary) hypertension: Secondary | ICD-10-CM | POA: Diagnosis not present

## 2019-04-05 DIAGNOSIS — K219 Gastro-esophageal reflux disease without esophagitis: Secondary | ICD-10-CM | POA: Diagnosis not present

## 2019-04-05 MED ORDER — TERAZOSIN HCL 2 MG PO CAPS: 2.0000 mg | ORAL_CAPSULE | Freq: Every day | ORAL | 1 refills | Status: DC

## 2019-04-05 MED ORDER — CLONAZEPAM 0.5 MG PO TABS: 0.5000 mg | ORAL_TABLET | Freq: Two times a day (BID) | ORAL | 2 refills | Status: DC

## 2019-04-05 MED ORDER — FEXOFENADINE HCL 180 MG PO TABS: 180.0000 mg | ORAL_TABLET | Freq: Every day | ORAL | 11 refills | Status: DC

## 2019-04-05 NOTE — Progress Notes (Signed)
Subjective:    Patient ID: Marvin White, male    DOB: 01/25/1946, 73 y.o.   MRN: [id number]   HPI: Marvin White is a 73 y.o. male presenting for    Depression screen [facility] 2/9 10/14/2018 07/14/2018  Decreased Interest 1 0  Down, Depressed, Hopeless 1 0  PHQ - 2 Score 2 0  Altered sleeping 2 -  Tired, decreased energy 0 -  Change in appetite 1 -  Feeling bad or failure about yourself  0 -  Trouble concentrating 1 -  Moving slowly or fidgety/restless 0 -  Suicidal thoughts 0 -  PHQ-9 Score 6 -     Relevant past medical, surgical, family and social history reviewed and updated as indicated.  Interim medical history since our last visit reviewed. Allergies and medications reviewed and updated.  ROS:  Review of Systems  Constitutional: Negative.   HENT: Negative.   Eyes: Negative for visual disturbance.  Respiratory: Negative for cough and shortness of breath.   Cardiovascular: Negative for chest pain and leg swelling.  Gastrointestinal: Positive for diarrhea (2 months of 5-6 loose BMs in the morning, 2-3 days a week.). Negative for abdominal pain, nausea and vomiting.  Genitourinary: Negative for difficulty urinating.  Musculoskeletal: Negative for arthralgias and myalgias.  Skin: Negative for rash.  Neurological: Negative for headaches.  Psychiatric/Behavioral: Negative for sleep disturbance.     Social History   Tobacco Use  Smoking Status Never Smoker  Smokeless Tobacco Never Used       Objective:     Wt Readings from Last 3 Encounters:  10/14/18 204 lb 8 oz (92.8 kg)  10/07/18 207 lb (93.9 kg)  07/14/18 201 lb 9.6 oz (91.4 kg)     Exam deferred. Pt. Harboring due to COVID 19. Phone visit performed.   Assessment & Plan:   1. Essential hypertension   2. Mixed hyperlipidemia   3. GAD (generalized anxiety disorder)   4. Gastroesophageal reflux disease without esophagitis     Meds ordered this encounter  Medications  . fexofenadine (ALLEGRA)  180 MG tablet    Sig: Take 1 tablet (180 mg total) by mouth daily. For allergy symptoms    Dispense:  30 tablet    Refill:  11  . clonazePAM (KLONOPIN) 0.5 MG tablet    Sig: Take 1 tablet (0.5 mg total) by mouth 2 (two) times daily.    Dispense:  60 tablet    Refill:  2    This prescription was filled on 01/12/2019. Any refills authorized will be placed on file.  . terazosin (HYTRIN) 2 MG capsule    Sig: Take 1 capsule (2 mg total) by mouth at bedtime.    Dispense:  90 capsule    Refill:  1    This prescription was filled on 01/09/2019. Any refills authorized will be placed on file.    No orders of the defined types were placed in this encounter.     Diagnoses and all orders for this visit:  Essential hypertension  Mixed hyperlipidemia  GAD (generalized anxiety disorder)  Gastroesophageal reflux disease without esophagitis  Other orders -     fexofenadine (ALLEGRA) 180 MG tablet; Take 1 tablet (180 mg total) by mouth daily. For allergy symptoms -     clonazePAM (KLONOPIN) 0.5 MG tablet; Take 1 tablet (0.5 mg total) by mouth 2 (two) times daily. -     terazosin (HYTRIN) 2 MG capsule; Take 1 capsule (2 mg total) by mouth at  bedtime.    Virtual Visit via telephone Note  I discussed the limitations, risks, security and privacy concerns of performing an evaluation and management service by telephone and the availability of in person appointments. The patient was identified with two identifiers. Pt.expressed understanding and agreed to proceed. Pt. Is at home. Dr. Livia Snellen is in his office.  Follow Up Instructions:   I discussed the assessment and treatment plan with the patient. The patient was provided an opportunity to ask questions and all were answered. The patient agreed with the plan and demonstrated an understanding of the instructions.   The patient was advised to call back or seek an in-person evaluation if the symptoms worsen or if the condition fails to improve as  anticipated.   Total minutes including chart review and phone contact time: 25   Follow up plan: Return in about 6 months (around 10/06/2019).  Claretta Fraise, MD [place]

## 2019-04-07 DIAGNOSIS — H353221 Exudative age-related macular degeneration, left eye, with active choroidal neovascularization: Secondary | ICD-10-CM | POA: Diagnosis not present

## 2019-04-07 DIAGNOSIS — H353121 Nonexudative age-related macular degeneration, left eye, early dry stage: Secondary | ICD-10-CM | POA: Diagnosis not present

## 2019-04-07 DIAGNOSIS — H353122 Nonexudative age-related macular degeneration, left eye, intermediate dry stage: Secondary | ICD-10-CM | POA: Diagnosis not present

## 2019-04-07 DIAGNOSIS — H2512 Age-related nuclear cataract, left eye: Secondary | ICD-10-CM | POA: Diagnosis not present

## 2019-04-07 DIAGNOSIS — H43812 Vitreous degeneration, left eye: Secondary | ICD-10-CM | POA: Diagnosis not present

## 2019-05-05 DIAGNOSIS — H3530 Unspecified macular degeneration: Secondary | ICD-10-CM | POA: Diagnosis not present

## 2019-05-05 DIAGNOSIS — H35371 Puckering of macula, right eye: Secondary | ICD-10-CM | POA: Diagnosis not present

## 2019-05-05 DIAGNOSIS — H2513 Age-related nuclear cataract, bilateral: Secondary | ICD-10-CM | POA: Diagnosis not present

## 2019-05-05 DIAGNOSIS — H43813 Vitreous degeneration, bilateral: Secondary | ICD-10-CM | POA: Diagnosis not present

## 2019-05-12 DIAGNOSIS — H2512 Age-related nuclear cataract, left eye: Secondary | ICD-10-CM | POA: Diagnosis not present

## 2019-05-12 DIAGNOSIS — H353121 Nonexudative age-related macular degeneration, left eye, early dry stage: Secondary | ICD-10-CM | POA: Diagnosis not present

## 2019-05-12 DIAGNOSIS — H353221 Exudative age-related macular degeneration, left eye, with active choroidal neovascularization: Secondary | ICD-10-CM | POA: Diagnosis not present

## 2019-05-12 DIAGNOSIS — H353122 Nonexudative age-related macular degeneration, left eye, intermediate dry stage: Secondary | ICD-10-CM | POA: Diagnosis not present

## 2019-05-18 DIAGNOSIS — H353211 Exudative age-related macular degeneration, right eye, with active choroidal neovascularization: Secondary | ICD-10-CM | POA: Diagnosis not present

## 2019-05-18 DIAGNOSIS — H353121 Nonexudative age-related macular degeneration, left eye, early dry stage: Secondary | ICD-10-CM | POA: Diagnosis not present

## 2019-05-18 DIAGNOSIS — H43811 Vitreous degeneration, right eye: Secondary | ICD-10-CM | POA: Diagnosis not present

## 2019-05-18 DIAGNOSIS — H35371 Puckering of macula, right eye: Secondary | ICD-10-CM | POA: Diagnosis not present

## 2019-05-26 ENCOUNTER — Telehealth: Payer: Self-pay | Admitting: *Deleted

## 2019-05-26 NOTE — Telephone Encounter (Signed)
Fax from [place] for Tadalifil 5 mg #20 1 QD prn

## 2019-05-27 ENCOUNTER — Telehealth: Payer: Self-pay | Admitting: Family Medicine

## 2019-05-27 ENCOUNTER — Other Ambulatory Visit: Payer: Self-pay | Admitting: Family Medicine

## 2019-05-27 MED ORDER — TADALAFIL 5 MG PO TABS: 5.0000 mg | ORAL_TABLET | Freq: Every day | ORAL | 5 refills | Status: DC | PRN

## 2019-05-27 NOTE — Telephone Encounter (Signed)
I sent in the requested prescription 

## 2019-06-14 ENCOUNTER — Other Ambulatory Visit: Payer: Self-pay | Admitting: Family Medicine

## 2019-06-14 DIAGNOSIS — H353221 Exudative age-related macular degeneration, left eye, with active choroidal neovascularization: Secondary | ICD-10-CM | POA: Diagnosis not present

## 2019-06-14 DIAGNOSIS — H353122 Nonexudative age-related macular degeneration, left eye, intermediate dry stage: Secondary | ICD-10-CM | POA: Diagnosis not present

## 2019-06-14 DIAGNOSIS — H43812 Vitreous degeneration, left eye: Secondary | ICD-10-CM | POA: Diagnosis not present

## 2019-06-14 DIAGNOSIS — H2512 Age-related nuclear cataract, left eye: Secondary | ICD-10-CM | POA: Diagnosis not present

## 2019-06-14 DIAGNOSIS — H353121 Nonexudative age-related macular degeneration, left eye, early dry stage: Secondary | ICD-10-CM | POA: Diagnosis not present

## 2019-07-01 ENCOUNTER — Telehealth: Payer: Self-pay | Admitting: Neurology

## 2019-07-01 ENCOUNTER — Other Ambulatory Visit: Payer: Self-pay | Admitting: Family Medicine

## 2019-07-01 NOTE — Telephone Encounter (Signed)
I called patient today regarding rescheduling his 12/4 appt with Amy NP which falls on a Friday. Because we are closed this day, I rescheduled his appt to 12/3 for cpap f/u. While speaking with patient he mentioned that his cpap is on setting 5 as Dr. Brett Fairy described, but has been feeling like he has a cold each morning when he wakes up d/t coughing and spitting up mucus, etc. He states that last night he switched to setting 4 and felt better when he woke up this morning. Patient asked me if this was okay to do. I advised patient that I am not clinical but I would certainly give the message to nurse and MD and someone would call to discuss. Patient verbalized understanding.

## 2019-07-01 NOTE — Telephone Encounter (Signed)
Called the patient back and he informed me that he was changing the humidity pressure not the actual CPAP pressure. Advised the patient that it was ok if he wanted to adjust his humidity level. Pt verbalized understanding. He is using the humidity level at 4 now and states he is much better at that range. He cleans his machine well and also using nasacort.

## 2019-07-13 DIAGNOSIS — H35371 Puckering of macula, right eye: Secondary | ICD-10-CM | POA: Diagnosis not present

## 2019-07-13 DIAGNOSIS — H43811 Vitreous degeneration, right eye: Secondary | ICD-10-CM | POA: Diagnosis not present

## 2019-07-13 DIAGNOSIS — H353211 Exudative age-related macular degeneration, right eye, with active choroidal neovascularization: Secondary | ICD-10-CM | POA: Diagnosis not present

## 2019-07-19 ENCOUNTER — Other Ambulatory Visit: Payer: Self-pay | Admitting: Family Medicine

## 2019-07-19 DIAGNOSIS — H353211 Exudative age-related macular degeneration, right eye, with active choroidal neovascularization: Secondary | ICD-10-CM | POA: Diagnosis not present

## 2019-07-19 DIAGNOSIS — H353121 Nonexudative age-related macular degeneration, left eye, early dry stage: Secondary | ICD-10-CM | POA: Diagnosis not present

## 2019-07-19 DIAGNOSIS — H35371 Puckering of macula, right eye: Secondary | ICD-10-CM | POA: Diagnosis not present

## 2019-07-19 DIAGNOSIS — H353221 Exudative age-related macular degeneration, left eye, with active choroidal neovascularization: Secondary | ICD-10-CM | POA: Diagnosis not present

## 2019-08-03 DIAGNOSIS — Z23 Encounter for immunization: Secondary | ICD-10-CM | POA: Diagnosis not present

## 2019-08-18 ENCOUNTER — Other Ambulatory Visit: Payer: Self-pay

## 2019-08-26 ENCOUNTER — Telehealth (INDEPENDENT_AMBULATORY_CARE_PROVIDER_SITE_OTHER): Payer: Medicare Other | Admitting: Family Medicine

## 2019-08-26 ENCOUNTER — Encounter: Payer: Self-pay | Admitting: Family Medicine

## 2019-08-26 ENCOUNTER — Other Ambulatory Visit: Payer: Self-pay

## 2019-08-26 DIAGNOSIS — Z9989 Dependence on other enabling machines and devices: Secondary | ICD-10-CM | POA: Diagnosis not present

## 2019-08-26 DIAGNOSIS — G4733 Obstructive sleep apnea (adult) (pediatric): Secondary | ICD-10-CM | POA: Diagnosis not present

## 2019-08-26 NOTE — Progress Notes (Signed)
   PATIENT: Marvin White DOB: 1945/12/18  REASON FOR VISIT: follow up HISTORY FROM: patient  Virtual Visit via Telephone Note  I connected with Marvin White on 08/26/19 at  1:30 PM EST by telephone and verified that I am speaking with the correct person using two identifiers.   I discussed the limitations, risks, security and privacy concerns of performing an evaluation and management service by telephone and the availability of in person appointments. I also discussed with the patient that there may be a patient responsible charge related to this service. The patient expressed understanding and agreed to proceed.   History of Present Illness:  08/26/19 Marvin White is a 73 y.o. male here today for follow up for OSA on CPAP.  He continues to adjust to CPAP therapy.  He reports he is now using CPAP nightly.  He continues to adjust humidity settings for comfort.  He has not noted any improvement in daytime energy, however, does note that he is sleeping better.  He does not wake up as frequently at night.  Compliance report dated 07/26/2019 through 08/24/2019 reveals that he is using CPAP every night for compliance of 100%.  Every night he uses CPAP greater than 4 hours for compliance of 100%.  On average he uses CPAP about 8 hours.  Residual AHI is 1.5 on 5 to 10 cm of water and an EPR of 3.  There was no significant leak noted.   Observations/Objective:  Generalized: Well developed, in no acute distress  Mentation: Alert oriented to time, place, history taking. Follows all commands speech and language fluent   Assessment and Plan:  73 y.o. year old male  has a past medical history of Chickenpox, Depression, High blood pressure, and High cholesterol. here with    ICD-10-CM   1. OSA on CPAP  G47.33    Z99.89    Marvin White has adapted well to CPAP therapy.  Compliance report reveals excellent compliance.  Although he has not noted significant improvement in fatigue, he does note  improvement in sleep quality.  He wishes to continue CPAP therapy.  He was encouraged to continue nightly use and greater than 4 hours each night.  Adequate hydration, well-balanced diet and regular exercise advised.  Regular follow-up with primary care recommended.  He will follow-up with me in 6 months, sooner if needed.  He verbalizes understanding and agreement with this plan.   No orders of the defined types were placed in this encounter.   No orders of the defined types were placed in this encounter.    Follow Up Instructions:  I discussed the assessment and treatment plan with the patient. The patient was provided an opportunity to ask questions and all were answered. The patient agreed with the plan and demonstrated an understanding of the instructions.   The patient was advised to call back or seek an in-person evaluation if the symptoms worsen or if the condition fails to improve as anticipated.  I provided 25 minutes of non-face-to-face time during this encounter.  Patient and his wife are located at their place of residence during [place].  My chart visit unable to be completed due to patient being unable to login.  Provider is in the office.   Debbora Presto, NP

## 2019-08-27 ENCOUNTER — Ambulatory Visit: Payer: Medicare Other | Admitting: Family Medicine

## 2019-08-30 DIAGNOSIS — H353122 Nonexudative age-related macular degeneration, left eye, intermediate dry stage: Secondary | ICD-10-CM | POA: Diagnosis not present

## 2019-08-30 DIAGNOSIS — H35371 Puckering of macula, right eye: Secondary | ICD-10-CM | POA: Diagnosis not present

## 2019-08-30 DIAGNOSIS — H353221 Exudative age-related macular degeneration, left eye, with active choroidal neovascularization: Secondary | ICD-10-CM | POA: Diagnosis not present

## 2019-09-06 ENCOUNTER — Encounter: Payer: Self-pay | Admitting: Family Medicine

## 2019-09-06 ENCOUNTER — Telehealth: Payer: Self-pay | Admitting: Family Medicine

## 2019-09-06 ENCOUNTER — Ambulatory Visit (INDEPENDENT_AMBULATORY_CARE_PROVIDER_SITE_OTHER): Payer: Medicare Other | Admitting: Family Medicine

## 2019-09-06 DIAGNOSIS — R05 Cough: Secondary | ICD-10-CM

## 2019-09-06 DIAGNOSIS — R059 Cough, unspecified: Secondary | ICD-10-CM

## 2019-09-06 DIAGNOSIS — G4733 Obstructive sleep apnea (adult) (pediatric): Secondary | ICD-10-CM

## 2019-09-06 DIAGNOSIS — J988 Other specified respiratory disorders: Secondary | ICD-10-CM

## 2019-09-06 DIAGNOSIS — E782 Mixed hyperlipidemia: Secondary | ICD-10-CM

## 2019-09-06 DIAGNOSIS — F411 Generalized anxiety disorder: Secondary | ICD-10-CM

## 2019-09-06 DIAGNOSIS — J398 Other specified diseases of upper respiratory tract: Secondary | ICD-10-CM

## 2019-09-06 DIAGNOSIS — Z9989 Dependence on other enabling machines and devices: Secondary | ICD-10-CM | POA: Diagnosis not present

## 2019-09-06 MED ORDER — FENOFIBRATE 48 MG PO TABS: 48.0000 mg | ORAL_TABLET | Freq: Every day | ORAL | 1 refills | Status: DC

## 2019-09-06 MED ORDER — PANTOPRAZOLE SODIUM 40 MG PO TBEC: 40.0000 mg | DELAYED_RELEASE_TABLET | Freq: Every day | ORAL | 1 refills | Status: DC

## 2019-09-06 MED ORDER — PREDNISONE 10 MG PO TABS: ORAL_TABLET | ORAL | 0 refills | Status: DC

## 2019-09-06 MED ORDER — FEXOFENADINE-PSEUDOEPHED ER 180-240 MG PO TB24: 1.0000 | ORAL_TABLET | Freq: Every evening | ORAL | 11 refills | Status: DC

## 2019-09-06 MED ORDER — TERAZOSIN HCL 2 MG PO CAPS: 2.0000 mg | ORAL_CAPSULE | Freq: Every day | ORAL | 1 refills | Status: DC

## 2019-09-06 MED ORDER — MOMETASONE FUROATE 50 MCG/ACT NA SUSP
2.0000 | Freq: Two times a day (BID) | NASAL | 12 refills | Status: DC
Start: 2019-09-06 — End: 2019-11-10

## 2019-09-06 MED ORDER — CITALOPRAM HYDROBROMIDE 20 MG PO TABS: 20.0000 mg | ORAL_TABLET | Freq: Every day | ORAL | 1 refills | Status: DC

## 2019-09-06 NOTE — Telephone Encounter (Signed)
FYI  Patient's insurance would not cover nasonex.  Flonase was preferred.

## 2019-09-06 NOTE — Progress Notes (Signed)
Subjective:    Patient ID: Marvin White, male    DOB: December 22, 1945, 73 y.o.   MRN: [id number]   HPI: [name] is a 73 y.o. male presenting for in the mornings having a lot of congestion. Taking allegra at night. Using CPAP. Coughing with copious phlegm. Onset with starting CPAP 9 months ago.   Got intoxicated and fell in October. Fell 4-5 times. No more drinking. Last EtOH was 10/30, beer. No significant injury to report. Going to [employer] Prevention Program meetings.   BP is 115/71 currently. Weight is 214.4, pulse 107. The ongoing cough  Concerned about pulse being high, but very active this AM.   CoVID negative two months ago. Wants another this week due to   Depression screen [facility] 2/9 10/14/2018 07/14/2018  Decreased Interest 1 0  Down, Depressed, Hopeless 1 0  PHQ - 2 Score 2 0  Altered sleeping 2 -  Tired, decreased energy 0 -  Change in appetite 1 -  Feeling bad or failure about yourself  0 -  Trouble concentrating 1 -  Moving slowly or fidgety/restless 0 -  Suicidal thoughts 0 -  PHQ-9 Score 6 -     Relevant past medical, surgical, family and social history reviewed and updated as indicated.  Interim medical history since our last visit reviewed. Allergies and medications reviewed and updated.  ROS:  Review of Systems  Constitutional: Negative.   HENT: Positive for congestion and rhinorrhea.   Eyes: Negative for visual disturbance.  Respiratory: Positive for cough. Negative for shortness of breath.   Cardiovascular: Negative for chest pain and leg swelling.  Gastrointestinal: Negative for abdominal pain, diarrhea, nausea and vomiting.  Endocrine: Negative for polyuria.  Genitourinary: Negative for difficulty urinating.  Musculoskeletal: Negative for arthralgias and myalgias.  Skin: Negative for rash.  Neurological: Negative for headaches.  Psychiatric/Behavioral: Negative for sleep disturbance.     Social History   Tobacco Use  Smoking Status  Never Smoker  Smokeless Tobacco Never Used       Objective:     Wt Readings from Last 3 Encounters:  10/14/18 204 lb 8 oz (92.8 kg)  10/07/18 207 lb (93.9 kg)  07/14/18 201 lb 9.6 oz (91.4 kg)     Exam deferred. Pt. Harboring due to COVID 19. Phone visit performed.   Assessment & Plan:   1. OSA on CPAP   2. Congestion of upper respiratory tract   3. Cough   4. GAD (generalized anxiety disorder)   5. Mixed hyperlipidemia     Meds ordered this encounter  Medications  . fexofenadine-pseudoephedrine (ALLEGRA-D 24) 180-240 MG 24 hr tablet    Sig: Take 1 tablet by mouth every evening. For allergy and congestion    Dispense:  30 tablet    Refill:  11  . mometasone (NASONEX) 50 MCG/ACT nasal spray    Sig: Place 2 sprays into the nose 2 (two) times daily.    Dispense:  17 g    Refill:  12  . fenofibrate (TRICOR) 48 MG tablet    Sig: Take 1 tablet (48 mg total) by mouth daily.    Dispense:  90 tablet    Refill:  1  . citalopram (CELEXA) 20 MG tablet    Sig: Take 1 tablet (20 mg total) by mouth daily.    Dispense:  90 tablet    Refill:  1  . pantoprazole (PROTONIX) 40 MG tablet    Sig: Take 1 tablet (40 mg total)  by mouth daily.    Dispense:  90 tablet    Refill:  1    This prescription was filled on 06/14/2019. Any refills authorized will be placed on file.  . terazosin (HYTRIN) 2 MG capsule    Sig: Take 1 capsule (2 mg total) by mouth at bedtime.    Dispense:  90 capsule    Refill:  1    This prescription was filled on 01/09/2019. Any refills authorized will be placed on file.  . predniSONE (DELTASONE) 10 MG tablet    Sig: Take 5 daily for 2 days followed by 4,3,2 and 1 for 2 days each.    Dispense:  30 tablet    Refill:  0    No orders of the defined types were placed in this encounter.     Diagnoses and all orders for this visit:  OSA on CPAP  Congestion of upper respiratory tract  Cough  GAD (generalized anxiety disorder)  Mixed hyperlipidemia  Other  orders -     fexofenadine-pseudoephedrine (ALLEGRA-D 24) 180-240 MG 24 hr tablet; Take 1 tablet by mouth every evening. For allergy and congestion -     mometasone (NASONEX) 50 MCG/ACT nasal spray; Place 2 sprays into the nose 2 (two) times daily. -     fenofibrate (TRICOR) 48 MG tablet; Take 1 tablet (48 mg total) by mouth daily. -     citalopram (CELEXA) 20 MG tablet; Take 1 tablet (20 mg total) by mouth daily. -     pantoprazole (PROTONIX) 40 MG tablet; Take 1 tablet (40 mg total) by mouth daily. -     terazosin (HYTRIN) 2 MG capsule; Take 1 capsule (2 mg total) by mouth at bedtime. -     predniSONE (DELTASONE) 10 MG tablet; Take 5 daily for 2 days followed by 4,3,2 and 1 for 2 days each.    Virtual Visit via telephone Note  I discussed the limitations, risks, security and privacy concerns of performing an evaluation and management service by telephone and the availability of in person appointments. The patient was identified with two identifiers. Pt.expressed understanding and agreed to proceed. Pt. Is at home. Dr. Livia Snellen is in his office.  Follow Up Instructions:   I discussed the assessment and treatment plan with the patient. The patient was provided an opportunity to ask questions and all were answered. The patient agreed with the plan and demonstrated an understanding of the instructions.   The patient was advised to call back or seek an in-person evaluation if the symptoms worsen or if the condition fails to improve as anticipated.   Total minutes including chart review and phone contact time: 27   Follow up plan: Return in about 6 months (around 03/06/2020).  Claretta Fraise, MD [place]

## 2019-09-06 NOTE — Telephone Encounter (Signed)
Needs prior auth done. Pt. Has tried flonase and nasacort and gotten no relief. He uses cpap so he needs something to help with nasal swelling from the mask.

## 2019-09-14 ENCOUNTER — Other Ambulatory Visit: Payer: Self-pay | Admitting: Family Medicine

## 2019-09-20 ENCOUNTER — Ambulatory Visit: Payer: Medicare Other

## 2019-09-28 ENCOUNTER — Telehealth: Payer: Self-pay | Admitting: Family Medicine

## 2019-09-28 NOTE — Telephone Encounter (Signed)
I discussed with the patient that he could go to TID clonazepam for the next few (3-5) days.. If that was not sufficient to control his symptoms, he would need help -E.D. eval for DTs.  Pt. Has a history of binge drinking. Last binge was 2 years ago. Went on a binge for New Years. Woke up yesterday too weak to walk. Getting better but is having some shakes. Requests ativan since it was given before. That request was denied, but I did offer the above advice. I emphasized the need to get help if symptoms were not quickly controlled with clonazepam as DTs can cause seizures, etc. Pt. Voiced understanding.

## 2019-10-13 ENCOUNTER — Ambulatory Visit: Payer: Medicare Other

## 2019-10-14 ENCOUNTER — Other Ambulatory Visit: Payer: Self-pay | Admitting: Family Medicine

## 2019-10-14 DIAGNOSIS — H353122 Nonexudative age-related macular degeneration, left eye, intermediate dry stage: Secondary | ICD-10-CM | POA: Diagnosis not present

## 2019-10-14 DIAGNOSIS — H353221 Exudative age-related macular degeneration, left eye, with active choroidal neovascularization: Secondary | ICD-10-CM | POA: Diagnosis not present

## 2019-10-14 DIAGNOSIS — H35371 Puckering of macula, right eye: Secondary | ICD-10-CM | POA: Diagnosis not present

## 2019-10-14 DIAGNOSIS — H43812 Vitreous degeneration, left eye: Secondary | ICD-10-CM | POA: Diagnosis not present

## 2019-10-14 NOTE — Telephone Encounter (Signed)
Patient aware and states that he does not need a refill at this time.

## 2019-10-14 NOTE — Telephone Encounter (Signed)
[place]bs Controlled substance must be filled in office visit

## 2019-10-18 ENCOUNTER — Ambulatory Visit (INDEPENDENT_AMBULATORY_CARE_PROVIDER_SITE_OTHER): Payer: Medicare Other | Admitting: Family Medicine

## 2019-10-18 ENCOUNTER — Encounter: Payer: Self-pay | Admitting: Family Medicine

## 2019-10-18 DIAGNOSIS — R2689 Other abnormalities of gait and mobility: Secondary | ICD-10-CM

## 2019-10-18 DIAGNOSIS — H8303 Labyrinthitis, bilateral: Secondary | ICD-10-CM | POA: Diagnosis not present

## 2019-10-18 DIAGNOSIS — F1020 Alcohol dependence, uncomplicated: Secondary | ICD-10-CM | POA: Diagnosis not present

## 2019-10-18 DIAGNOSIS — F411 Generalized anxiety disorder: Secondary | ICD-10-CM | POA: Diagnosis not present

## 2019-10-18 MED ORDER — PREDNISONE 10 MG PO TABS: ORAL_TABLET | ORAL | 0 refills | Status: DC

## 2019-10-18 MED ORDER — DOXYCYCLINE HYCLATE 100 MG PO CAPS: 100.0000 mg | ORAL_CAPSULE | Freq: Two times a day (BID) | ORAL | 0 refills | Status: DC

## 2019-10-18 MED ORDER — CLONAZEPAM 0.5 MG PO TABS: 0.5000 mg | ORAL_TABLET | Freq: Two times a day (BID) | ORAL | 2 refills | Status: DC

## 2019-10-18 NOTE — Progress Notes (Signed)
Subjective:    Patient ID: Marvin White, male    DOB: 04-23-1946, 74 y.o.   MRN: [id number]   HPI: Marvin White is a 74 y.o. male presenting for dizziness for two days when awakening and sitting on the side of the bed. At night head is spinning. Has fallen six times in the last year. 4 when he was drinking. Last drink was January 3. Fell last night when he bent over and lost his balance.. Denies earache. Having loss of hearing - not as good as it should be.Has been taking Vitamin B complex.    Depression screen [facility] 2/9 10/14/2018 07/14/2018  Decreased Interest 1 0  Down, Depressed, Hopeless 1 0  PHQ - 2 Score 2 0  Altered sleeping 2 -  Tired, decreased energy 0 -  Change in appetite 1 -  Feeling bad or failure about yourself  0 -  Trouble concentrating 1 -  Moving slowly or fidgety/restless 0 -  Suicidal thoughts 0 -  PHQ-9 Score 6 -     Relevant past medical, surgical, family and social history reviewed and updated as indicated.  Interim medical history since our last visit reviewed. Allergies and medications reviewed and updated.  ROS:  Review of Systems  Constitutional: Negative for fever.  Respiratory: Negative for shortness of breath.   Cardiovascular: Negative for chest pain.  Musculoskeletal: Positive for arthralgias and gait problem.  Skin: Negative for rash.  Neurological: Positive for dizziness.     Social History   Tobacco Use  Smoking Status Never Smoker  Smokeless Tobacco Never Used       Objective:     Wt Readings from Last 3 Encounters:  10/14/18 204 lb 8 oz (92.8 kg)  10/07/18 207 lb (93.9 kg)  07/14/18 201 lb 9.6 oz (91.4 kg)     Exam deferred. Pt. Harboring due to COVID 19. Phone visit performed.   Assessment & Plan:   1. Labyrinthitis, acute viral, bilateral   2. Alcoholism ([place])   3. Imbalance   4. GAD (generalized anxiety disorder)     Meds ordered this encounter  Medications  . clonazePAM (KLONOPIN) 0.5 MG tablet   Sig: Take 1 tablet (0.5 mg total) by mouth 2 (two) times daily.    Dispense:  60 tablet    Refill:  2    This prescription was filled on 07/19/2019. Any refills authorized will be placed on file.  . predniSONE (DELTASONE) 10 MG tablet    Sig: Take 5 daily for 2 days followed by 4,3,2 and 1 for 2 days each.    Dispense:  30 tablet    Refill:  0  . doxycycline (VIBRAMYCIN) 100 MG capsule    Sig: Take 1 capsule (100 mg total) by mouth 2 (two) times daily.    Dispense:  20 capsule    Refill:  0    No orders of the defined types were placed in this encounter.     Diagnoses and all orders for this visit:  Labyrinthitis, acute viral, bilateral -     predniSONE (DELTASONE) 10 MG tablet; Take 5 daily for 2 days followed by 4,3,2 and 1 for 2 days each. -     doxycycline (VIBRAMYCIN) 100 MG capsule; Take 1 capsule (100 mg total) by mouth 2 (two) times daily.  Alcoholism ([place])  Imbalance  GAD (generalized anxiety disorder) -     clonazePAM (KLONOPIN) 0.5 MG tablet; Take 1 tablet (0.5 mg total) by mouth 2 (two)  times daily.   Patient has detoxed himself apparently over the last 3 weeks.  We had a discussion about needing professional help for detox when he was drinking heavily several weeks ago.  At that time he was reluctant to detox at all.  Now he has detoxed himself apparently.  His last drink was 3 weeks ago.  Unfortunately he has had a couple of falls.  This he relates to imbalance that comes from the vertigo that he is describing.  Clonazepam actually should help calm that just a bit.  The labyrinthitis should respond to the prednisone and or Vibramycin.  He is a long-term GAD patient who self medicates with alcohol.  He is also been maintained on clonazepam long-term.  I explained to him that we could not allow him to have both.  The combination of clonazepam and alcohol are very similar to what is conventionally known as a rufie and unsafe for anyone. Virtual Visit via telephone  Note  I discussed the limitations, risks, security and privacy concerns of performing an evaluation and management service by telephone and the availability of in person appointments. The patient was identified with two identifiers. Pt.expressed understanding and agreed to proceed. Pt. Is at home. Dr. Livia Snellen is in his office.  Follow Up Instructions:   I discussed the assessment and treatment plan with the patient. The patient was provided an opportunity to ask questions and all were answered. The patient agreed with the plan and demonstrated an understanding of the instructions.   The patient was advised to call back or seek an in-person evaluation if the symptoms worsen or if the condition fails to improve as anticipated.   Total minutes including chart review and phone contact time: 28   Follow up plan: Return in about 3 months (around 01/16/2020), or if symptoms worsen or fail to improve.  Claretta Fraise, MD [place]

## 2019-10-29 ENCOUNTER — Encounter: Payer: Self-pay | Admitting: Nurse Practitioner

## 2019-10-29 ENCOUNTER — Ambulatory Visit (INDEPENDENT_AMBULATORY_CARE_PROVIDER_SITE_OTHER): Payer: Medicare Other | Admitting: Nurse Practitioner

## 2019-10-29 DIAGNOSIS — R42 Dizziness and giddiness: Secondary | ICD-10-CM

## 2019-10-29 DIAGNOSIS — Z23 Encounter for immunization: Secondary | ICD-10-CM | POA: Diagnosis not present

## 2019-10-29 NOTE — Progress Notes (Signed)
   Virtual Visit via telephone Note Due to COVID-19 pandemic this visit was conducted virtually. This visit type was conducted due to national recommendations for restrictions regarding the COVID-19 Pandemic (e.g. social distancing, sheltering in place) in an effort to limit this patient's exposure and mitigate transmission in our community. All issues noted in this document were discussed and addressed.  A physical exam was not performed with this format.  I connected with Marvin White on 10/29/19 at 2:05 by telephone and verified that I am speaking with the correct person using two identifiers. Marvin White is currently located at home and his wife  is currently with him during visit. The provider, Mary-Margaret Hassell Done, FNP is located in their office at time of visit.  I discussed the limitations, risks, security and privacy concerns of performing an evaluation and management service by telephone and the availability of in person appointments. I also discussed with the patient that there may be a patient responsible charge related to this service. The patient expressed understanding and agreed to proceed.   History and Present Illness:   Chief Complaint: Dizziness   HPI Patient calls in c/o dizziness. Patient talked with dr. Livia Snellen 1 week ago with same symptoms. Dr. Livia Snellen gave him prednisone and doxycycline. He said he is still dizzy in the mornings and at night. He is taking meclizine 4x a day and is still dizzy. He feels no better. He says he only has dizziness when he is getting up in the morning and when he goes to bed at night. Only last about 4-5 seconds   Review of Systems  Constitutional: Negative.   Respiratory: Negative.   Cardiovascular: Negative.   Genitourinary: Negative.   Skin: Negative.   Neurological: Positive for dizziness.  Psychiatric/Behavioral: Negative.   All other systems reviewed and are negative.    Observations/Objective: Alert and oriented- answers  all questions appropriately No distress    Assessment and Plan: Marvin White in today with chief complaint of Dizziness   1. Vertigo Force fluids Rest Rice slowly from sitting standing Continue meclizine as rx Referral to neurology     Follow Up Instructions: prn    I discussed the assessment and treatment plan with the patient. The patient was provided an opportunity to ask questions and all were answered. The patient agreed with the plan and demonstrated an understanding of the instructions.   The patient was advised to call back or seek an in-person evaluation if the symptoms worsen or if the condition fails to improve as anticipated.  The above assessment and management plan was discussed with the patient. The patient verbalized understanding of and has agreed to the management plan. Patient is aware to call the clinic if symptoms persist or worsen. Patient is aware when to return to the clinic for a follow-up visit. Patient educated on when it is appropriate to go to the emergency department.   Time call ended:  2:17  I provided 12 minutes of non-face-to-face time during this encounter.    Mary-Margaret Hassell Done, FNP

## 2019-11-10 ENCOUNTER — Telehealth: Payer: Self-pay | Admitting: *Deleted

## 2019-11-10 ENCOUNTER — Ambulatory Visit (INDEPENDENT_AMBULATORY_CARE_PROVIDER_SITE_OTHER): Payer: Medicare Other | Admitting: *Deleted

## 2019-11-10 ENCOUNTER — Other Ambulatory Visit: Payer: Self-pay

## 2019-11-10 VITALS — Ht 66.0 in | Wt 204.6 lb

## 2019-11-10 DIAGNOSIS — Z0000 Encounter for general adult medical examination without abnormal findings: Secondary | ICD-10-CM

## 2019-11-10 NOTE — Progress Notes (Signed)
MEDICARE ANNUAL WELLNESS VISIT  11/10/2019  Telephone Visit Disclaimer This Medicare AWV was conducted by telephone due to national recommendations for restrictions regarding the COVID-19 Pandemic (e.g. social distancing).  I verified, using two identifiers, that I am speaking with Marvin White or their authorized healthcare agent. I discussed the limitations, risks, security, and privacy concerns of performing an evaluation and management service by telephone and the potential availability of an in-person appointment in the future. The patient expressed understanding and agreed to proceed.   Subjective:  Marvin White is a 74 y.o. male patient of Stacks, Cletus Gash, MD who had a Medicare Annual Wellness Visit today via telephone. Jr is Retired and lives with their spouse. he has 2 children. he reports that he is socially active and does interact with friends/family regularly. he is minimally physically active and enjoys fishing.  Patient Care Team: Claretta Fraise, MD as PCP - General (Family Medicine)  Advanced Directives 11/10/2019  Does Patient Have a Medical Advance Directive? Yes  Type of Advance Directive Living will;Healthcare Power of Attorney  Does patient want to make changes to medical advance directive? No - Patient declined  Copy of [place] in Chart? No - copy requested    Hospital Utilization Over the Past 12 Months: # of hospitalizations or ER visits: 0 # of surgeries: 0  Review of Systems    Patient reports that his overall health is unchanged compared to last year.  History obtained from chart review and the patient General ROS: negative  Patient Reported Readings (BP, Pulse, CBG, Weight, etc) none  Pain Assessment Pain : No/denies pain     Current Medications & Allergies (verified) Allergies as of 11/10/2019   No Known Allergies     Medication List       Accurate as of November 10, 2019  2:33 PM. If you have any questions,  ask your nurse or doctor.        STOP taking these medications   doxycycline 100 MG capsule Commonly known as: Vibramycin   mometasone 50 MCG/ACT nasal spray Commonly known as: Nasonex   predniSONE 10 MG tablet Commonly known as: DELTASONE     TAKE these medications   b complex vitamins tablet Take 1 tablet by mouth daily.   citalopram 20 MG tablet Commonly known as: CELEXA Take 1 tablet (20 mg total) by mouth daily.   clonazePAM 0.5 MG tablet Commonly known as: KLONOPIN Take 1 tablet (0.5 mg total) by mouth 2 (two) times daily.   fenofibrate 48 MG tablet Commonly known as: TRICOR Take 1 tablet (48 mg total) by mouth daily.   fexofenadine-pseudoephedrine 180-240 MG 24 hr tablet Commonly known as: ALLEGRA-D 24 Take 1 tablet by mouth every evening. For allergy and congestion   fluticasone 50 MCG/ACT nasal spray Commonly known as: FLONASE Place 2 sprays into both nostrils 2 (two) times daily.   pantoprazole 40 MG tablet Commonly known as: PROTONIX Take 1 tablet (40 mg total) by mouth daily.   tadalafil 5 MG tablet Commonly known as: CIALIS TAKE ONE TABLET BY MOUTH DAILY AS NEEDED.   terazosin 2 MG capsule Commonly known as: HYTRIN Take 1 capsule (2 mg total) by mouth at bedtime.       History (reviewed): Past Medical History:  Diagnosis Date  . Chickenpox   . Depression   . High blood pressure   . High cholesterol    Past Surgical History:  Procedure Laterality Date  . HERNIA  REPAIR     abdominal  1998 2008, inguinal  . TONSILLECTOMY     Family History  Problem Relation Age of Onset  . Liver cancer Brother   . Stroke Maternal Uncle   . Stroke Paternal Uncle   . Heart attack Maternal Grandfather   . Colon cancer Son   . Kidney cancer Son    Social History   Socioeconomic History  . Marital status: Married    Spouse name: Not on file  . Number of children: Not on file  . Years of education: Not on file  . Highest education level: Not on  file  Occupational History  . Not on file  Tobacco Use  . Smoking status: Never Smoker  . Smokeless tobacco: Never Used  Substance and Sexual Activity  . Alcohol use: Not Currently    Comment: quit 07/27/2016  . Drug use: No  . Sexual activity: Yes  Other Topics Concern  . Not on file  Social History Narrative  . Not on file   Social Determinants of Health   Financial Resource Strain: Low Risk   . Difficulty of Paying Living Expenses: Not hard at all  Food Insecurity: No Food Insecurity  . Worried About Charity fundraiser in the Last Year: Never true  . Ran Out of Food in the Last Year: Never true  Transportation Needs: No Transportation Needs  . Lack of Transportation (Medical): No  . Lack of Transportation (Non-Medical): No  Physical Activity: Inactive  . Days of Exercise per Week: 0 days  . Minutes of Exercise per Session: 0 min  Stress: No Stress Concern Present  . Feeling of Stress : Only a little  Social Connections: Not Isolated  . Frequency of Communication with Friends and Family: More than three times a week  . Frequency of Social Gatherings with Friends and Family: More than three times a week  . Attends Religious Services: More than 4 times per year  . Active Member of Clubs or Organizations: Yes  . Attends Archivist Meetings: More than 4 times per year  . Marital Status: Married    Activities of Daily Living In your present state of health, do you have any difficulty performing the following activities: 11/10/2019  Hearing? N  Vision? Y  Difficulty concentrating or making decisions? Y  Walking or climbing stairs? N  Dressing or bathing? N  Doing errands, shopping? N  Preparing Food and eating ? N  Using the Toilet? N  In the past six months, have you accidently leaked urine? N  Do you have problems with loss of bowel control? N  Managing your Medications? N  Managing your Finances? N  Housekeeping or managing your Housekeeping? N  Some  recent data might be hidden    Patient Education/ Literacy How often do you need to have someone help you when you read instructions, pamphlets, or other written materials from your doctor or pharmacy?: 1 - Never What is the last grade level you completed in school?: Associates Degree  Exercise Current Exercise Habits: The patient does not participate in regular exercise at present, Exercise limited by: None identified  Diet Patient reports consuming 2 meals a day and 2 snack(s) a day Patient reports that his primary diet is: Regular Patient reports that she does have regular access to food.   Depression Screen PHQ 2/9 Scores 11/10/2019 11/10/2019 10/14/2018 07/14/2018  PHQ - 2 Score 0 0 2 0  PHQ- 9 Score - - 6 -  Fall Risk Fall Risk  11/10/2019 11/10/2019 08/18/2019 07/14/2018  Falls in the past year? 1 1 1  No  Comment - - Emmi Telephone Survey: data to providers prior to load -  Number falls in past yr: 1 1 1  -  Comment - - Emmi Telephone Survey Actual Response = 5 -  Injury with Fall? 0 0 0 -  Risk for fall due to : History of fall(s) History of fall(s) - -  Follow up Falls evaluation completed Falls evaluation completed - -     Objective:  Marvin White seemed alert and oriented and he participated appropriately during our telephone visit.  Blood Pressure Weight BMI  BP Readings from Last 3 Encounters:  10/14/18 (!) 95/53  10/07/18 140/81  07/14/18 (!) 97/55   Wt Readings from Last 3 Encounters:  11/10/19 204 lb 9.4 oz (92.8 kg)  10/14/18 204 lb 8 oz (92.8 kg)  10/07/18 207 lb (93.9 kg)   BMI Readings from Last 1 Encounters:  11/10/19 33.02 kg/m    *Unable to obtain current vital signs, weight, and BMI due to telephone visit type  Hearing/Vision  . Brayson did  seem to have difficulty with hearing/understanding during the telephone conversation . Reports that he has had a formal eye exam by an eye care professional within the past year . Reports that he has not  had a formal hearing evaluation within the past year *Unable to fully assess hearing and vision during telephone visit type  Cognitive Function: 6CIT Screen 11/10/2019  What Year? 0 points  What month? 0 points  What time? 0 points  Count back from 20 0 points  Months in reverse 0 points  Repeat phrase 0 points  Total Score 0   (Normal:0-7, Significant for Dysfunction: >8)  Normal Cognitive Function Screening: Yes   Immunization & Health Maintenance Record Immunization History  Administered Date(s) Administered  . Influenza, High Dose Seasonal PF 07/16/2018, 08/03/2019  . Pneumococcal Polysaccharide-23 11/24/2012  . Td 11/24/2012  . Zoster 11/24/2012    Health Maintenance  Topic Date Due  . PNA vac Low Risk Adult (1 of 2 - PCV13) 11/24/2013  . TETANUS/TDAP  11/25/2022  . COLONOSCOPY  01/03/2028  . INFLUENZA VACCINE  Completed  . Hepatitis C Screening  Completed       Assessment  This is a routine wellness examination for Marvin White.  Health Maintenance: Due or Overdue Health Maintenance Due  Topic Date Due  . PNA vac Low Risk Adult (1 of 2 - PCV13) 11/24/2013    Marvin White does not need a referral for Community Assistance: Care Management:   no Social Work:    no Prescription Assistance:  no Nutrition/Diabetes Education:  no   Plan:  Personalized Goals Goals Addressed            This Visit's Progress   . Exercise 3x per week (30 min per time)       Try to exercise for at least 30 minutes, 3 times weekly       Personalized Health Maintenance & Screening Recommendations  Pneumococcal vaccine   Lung Cancer Screening Recommended: no (Low Dose CT Chest recommended if Age 81-80 years, 30 pack-year currently smoking OR have quit w/in past 15 years) Hepatitis C Screening recommended: no HIV Screening recommended: no  Advanced Directives: Written information was not prepared per patient's request.  Referrals & Orders No orders of the  defined types were placed in this encounter.   Follow-up Plan .  Follow-up with Claretta Fraise, MD as planned   I have personally reviewed and noted the following in the patient's chart:   . Medical and social history . Use of alcohol, tobacco or illicit drugs  . Current medications and supplements . Functional ability and status . Nutritional status . Physical activity . Advanced directives . List of other physicians . Hospitalizations, surgeries, and ER visits in previous 12 months . Vitals . Screenings to include cognitive, depression, and falls . Referrals and appointments  In addition, I have reviewed and discussed with Marvin White certain preventive protocols, quality metrics, and best practice recommendations. A written personalized care plan for preventive services as well as general preventive health recommendations is available and can be mailed to the patient at his request.      Wardell Heath, LPN  [id number]

## 2019-11-10 NOTE — Telephone Encounter (Signed)
While on the phone with patient during AWV.  Patient states that he is still having dizziness.  Mainly in the mornings when first waking up. Marvin White states that he gets dizzy when he goes from lying to sitting in the mornings and sitting to lying in the evenings.  Dizziness only last for 5-10 seconds at a time.  Has occasional dizziness throughout the day but is not often.  He also states his BP has not been too high or too low.

## 2019-11-10 NOTE — Telephone Encounter (Signed)
Please contact the patient It sounds like he is improving. It isn't unusual to have some limited symptoms for a few weeks as long as getting better. Additionally, he will be a bit more susceptible to the side effect of dizziness that can be caused by clonazepam, Celexa and terazosin as a temporary after effect of the infection.

## 2019-11-10 NOTE — Telephone Encounter (Signed)
Patient aware and verbalized understanding. °

## 2019-11-10 NOTE — Patient Instructions (Signed)
  Marvin White , Thank you for taking time to come for your Medicare Wellness Visit. I appreciate your ongoing commitment to your health goals. Please review the following plan we discussed and let me know if I can assist you in the future.   These are the goals we discussed: Goals    . Exercise 3x per week (30 min per time)     Try to exercise for at least 30 minutes, 3 times weekly        This is a list of the screening recommended for you and due dates:  Health Maintenance  Topic Date Due  . Pneumonia vaccines (1 of 2 - PCV13) 11/24/2013  . Tetanus Vaccine  11/25/2022  . Colon Cancer Screening  01/03/2028  . Flu Shot  Completed  .  Hepatitis C: One time screening is recommended by Center for Disease Control  (CDC) for  adults born from [birth year] through 1963.   Completed

## 2019-11-18 DIAGNOSIS — H353121 Nonexudative age-related macular degeneration, left eye, early dry stage: Secondary | ICD-10-CM | POA: Diagnosis not present

## 2019-11-18 DIAGNOSIS — H353221 Exudative age-related macular degeneration, left eye, with active choroidal neovascularization: Secondary | ICD-10-CM | POA: Diagnosis not present

## 2019-11-18 DIAGNOSIS — H43812 Vitreous degeneration, left eye: Secondary | ICD-10-CM | POA: Diagnosis not present

## 2019-11-18 DIAGNOSIS — H35371 Puckering of macula, right eye: Secondary | ICD-10-CM | POA: Diagnosis not present

## 2019-11-23 DIAGNOSIS — H353121 Nonexudative age-related macular degeneration, left eye, early dry stage: Secondary | ICD-10-CM | POA: Diagnosis not present

## 2019-11-23 DIAGNOSIS — H43811 Vitreous degeneration, right eye: Secondary | ICD-10-CM | POA: Diagnosis not present

## 2019-11-23 DIAGNOSIS — H35371 Puckering of macula, right eye: Secondary | ICD-10-CM | POA: Diagnosis not present

## 2019-11-23 DIAGNOSIS — H353211 Exudative age-related macular degeneration, right eye, with active choroidal neovascularization: Secondary | ICD-10-CM | POA: Diagnosis not present

## 2019-11-25 DIAGNOSIS — S93332A Other subluxation of left foot, initial encounter: Secondary | ICD-10-CM | POA: Diagnosis not present

## 2019-11-25 DIAGNOSIS — M79674 Pain in right toe(s): Secondary | ICD-10-CM | POA: Diagnosis not present

## 2019-11-25 DIAGNOSIS — S93331A Other subluxation of right foot, initial encounter: Secondary | ICD-10-CM | POA: Diagnosis not present

## 2019-11-25 DIAGNOSIS — M79675 Pain in left toe(s): Secondary | ICD-10-CM | POA: Diagnosis not present

## 2019-11-25 DIAGNOSIS — M79671 Pain in right foot: Secondary | ICD-10-CM | POA: Diagnosis not present

## 2019-11-25 DIAGNOSIS — M79672 Pain in left foot: Secondary | ICD-10-CM | POA: Diagnosis not present

## 2019-11-25 DIAGNOSIS — M779 Enthesopathy, unspecified: Secondary | ICD-10-CM | POA: Diagnosis not present

## 2019-11-27 DIAGNOSIS — Z23 Encounter for immunization: Secondary | ICD-10-CM | POA: Diagnosis not present

## 2019-12-02 ENCOUNTER — Other Ambulatory Visit: Payer: Self-pay | Admitting: Family Medicine

## 2019-12-13 ENCOUNTER — Telehealth: Payer: Self-pay | Admitting: Family Medicine

## 2019-12-13 DIAGNOSIS — G4733 Obstructive sleep apnea (adult) (pediatric): Secondary | ICD-10-CM

## 2019-12-13 NOTE — Telephone Encounter (Signed)
Patient called stating he is needing a new prescription for cpap machine sent to [facility]. Pt states he was sent a letter to FU with prescribing physician due to his current prescription expiring  Attn: eika coleman Fax#[phone]

## 2019-12-14 ENCOUNTER — Institutional Professional Consult (permissible substitution): Payer: Medicare Other | Admitting: Neurology

## 2019-12-14 ENCOUNTER — Telehealth: Payer: Self-pay | Admitting: Neurology

## 2019-12-14 NOTE — Telephone Encounter (Signed)
Late entry: Patient called after hours call center on 12/10/2019 regarding needing a prescription for his CPAP machine.  I tried to contact patient through the call service but could not reach him.

## 2019-12-14 NOTE — Telephone Encounter (Signed)
I called Marvin White, spoke to Marvin White. Pt needs a new order for cpap supplies. It has to be sent yearly. Last order was on 11/27/18.  Order for cpap supplies has been placed and sent to [facility].  I called pt and advised him that this has been completed. Pt verbalized understanding.

## 2019-12-23 DIAGNOSIS — S93332D Other subluxation of left foot, subsequent encounter: Secondary | ICD-10-CM | POA: Diagnosis not present

## 2019-12-23 DIAGNOSIS — S93331D Other subluxation of right foot, subsequent encounter: Secondary | ICD-10-CM | POA: Diagnosis not present

## 2019-12-23 DIAGNOSIS — M779 Enthesopathy, unspecified: Secondary | ICD-10-CM | POA: Diagnosis not present

## 2019-12-23 DIAGNOSIS — M79671 Pain in right foot: Secondary | ICD-10-CM | POA: Diagnosis not present

## 2019-12-23 DIAGNOSIS — M79672 Pain in left foot: Secondary | ICD-10-CM | POA: Diagnosis not present

## 2019-12-23 DIAGNOSIS — M79674 Pain in right toe(s): Secondary | ICD-10-CM | POA: Diagnosis not present

## 2019-12-23 DIAGNOSIS — M79675 Pain in left toe(s): Secondary | ICD-10-CM | POA: Diagnosis not present

## 2019-12-27 ENCOUNTER — Ambulatory Visit (INDEPENDENT_AMBULATORY_CARE_PROVIDER_SITE_OTHER): Payer: Medicare Other | Admitting: Ophthalmology

## 2019-12-27 ENCOUNTER — Other Ambulatory Visit: Payer: Self-pay

## 2019-12-27 ENCOUNTER — Encounter (INDEPENDENT_AMBULATORY_CARE_PROVIDER_SITE_OTHER): Payer: Self-pay | Admitting: Ophthalmology

## 2019-12-27 DIAGNOSIS — H353211 Exudative age-related macular degeneration, right eye, with active choroidal neovascularization: Secondary | ICD-10-CM | POA: Diagnosis not present

## 2019-12-27 DIAGNOSIS — H2512 Age-related nuclear cataract, left eye: Secondary | ICD-10-CM | POA: Insufficient documentation

## 2019-12-27 DIAGNOSIS — H353221 Exudative age-related macular degeneration, left eye, with active choroidal neovascularization: Secondary | ICD-10-CM | POA: Insufficient documentation

## 2019-12-27 DIAGNOSIS — H43811 Vitreous degeneration, right eye: Secondary | ICD-10-CM | POA: Diagnosis not present

## 2019-12-27 DIAGNOSIS — H43812 Vitreous degeneration, left eye: Secondary | ICD-10-CM | POA: Insufficient documentation

## 2019-12-27 DIAGNOSIS — H353212 Exudative age-related macular degeneration, right eye, with inactive choroidal neovascularization: Secondary | ICD-10-CM | POA: Insufficient documentation

## 2019-12-27 HISTORY — DX: Age-related nuclear cataract, left eye: H25.12

## 2019-12-27 MED ORDER — BEVACIZUMAB CHEMO INJECTION 1.25MG/0.05ML SYRINGE FOR KALEIDOSCOPE
1.2500 mg | INTRAVITREAL | Status: AC | PRN
  Administered 2019-12-27: 1.25 mg via INTRAVITREAL

## 2019-12-27 NOTE — Assessment & Plan Note (Signed)

## 2019-12-29 ENCOUNTER — Other Ambulatory Visit: Payer: Self-pay

## 2019-12-29 ENCOUNTER — Ambulatory Visit (HOSPITAL_COMMUNITY): Payer: Medicare Other | Admitting: Clinical

## 2020-01-04 ENCOUNTER — Ambulatory Visit (HOSPITAL_COMMUNITY): Payer: BLUE CROSS/BLUE SHIELD | Admitting: Licensed Clinical Social Worker

## 2020-01-13 ENCOUNTER — Other Ambulatory Visit: Payer: Self-pay | Admitting: Family Medicine

## 2020-01-17 ENCOUNTER — Ambulatory Visit: Payer: Medicare Other | Admitting: Family Medicine

## 2020-01-18 ENCOUNTER — Other Ambulatory Visit: Payer: Self-pay | Admitting: Family Medicine

## 2020-01-18 DIAGNOSIS — F411 Generalized anxiety disorder: Secondary | ICD-10-CM

## 2020-01-19 ENCOUNTER — Encounter: Payer: Self-pay | Admitting: Family Medicine

## 2020-01-19 ENCOUNTER — Ambulatory Visit (INDEPENDENT_AMBULATORY_CARE_PROVIDER_SITE_OTHER): Payer: Medicare Other | Admitting: Family Medicine

## 2020-01-19 ENCOUNTER — Other Ambulatory Visit: Payer: Self-pay

## 2020-01-19 VITALS — BP 132/81 | HR 87 | Temp 97.6°F | Resp 20 | Ht 66.0 in | Wt 206.5 lb

## 2020-01-19 DIAGNOSIS — Z79899 Other long term (current) drug therapy: Secondary | ICD-10-CM | POA: Diagnosis not present

## 2020-01-19 DIAGNOSIS — Z125 Encounter for screening for malignant neoplasm of prostate: Secondary | ICD-10-CM

## 2020-01-19 DIAGNOSIS — N200 Calculus of kidney: Secondary | ICD-10-CM

## 2020-01-19 DIAGNOSIS — F1020 Alcohol dependence, uncomplicated: Secondary | ICD-10-CM | POA: Diagnosis not present

## 2020-01-19 DIAGNOSIS — F411 Generalized anxiety disorder: Secondary | ICD-10-CM | POA: Diagnosis not present

## 2020-01-19 DIAGNOSIS — G4733 Obstructive sleep apnea (adult) (pediatric): Secondary | ICD-10-CM

## 2020-01-19 DIAGNOSIS — I10 Essential (primary) hypertension: Secondary | ICD-10-CM

## 2020-01-19 DIAGNOSIS — E782 Mixed hyperlipidemia: Secondary | ICD-10-CM

## 2020-01-19 DIAGNOSIS — K219 Gastro-esophageal reflux disease without esophagitis: Secondary | ICD-10-CM

## 2020-01-19 MED ORDER — TERAZOSIN HCL 2 MG PO CAPS: 2.0000 mg | ORAL_CAPSULE | Freq: Every day | ORAL | 1 refills | Status: DC

## 2020-01-19 MED ORDER — PANTOPRAZOLE SODIUM 40 MG PO TBEC: 40.0000 mg | DELAYED_RELEASE_TABLET | Freq: Every day | ORAL | 1 refills | Status: DC

## 2020-01-19 MED ORDER — CLONAZEPAM 0.5 MG PO TABS: 0.5000 mg | ORAL_TABLET | Freq: Two times a day (BID) | ORAL | 5 refills | Status: DC

## 2020-01-19 MED ORDER — FENOFIBRATE 48 MG PO TABS: 48.0000 mg | ORAL_TABLET | Freq: Every day | ORAL | 1 refills | Status: DC

## 2020-01-19 MED ORDER — FEXOFENADINE-PSEUDOEPHED ER 180-240 MG PO TB24: 1.0000 | ORAL_TABLET | Freq: Every evening | ORAL | 11 refills | Status: AC

## 2020-01-19 MED ORDER — CITALOPRAM HYDROBROMIDE 20 MG PO TABS: 20.0000 mg | ORAL_TABLET | Freq: Every day | ORAL | 1 refills | Status: AC

## 2020-01-19 NOTE — Progress Notes (Signed)
Subjective:  Patient ID: Marvin White, male    DOB: 1946-06-09  Age: 74 y.o. MRN: [id number]  CC: Medical Management of Chronic Issues   HPI WIL SLAPE presents for anxiety  GAD 7 : Generalized Anxiety Score 01/19/2020 10/14/2018  Nervous, Anxious, on Edge 1 0  Control/stop worrying 0 0  Worry too much - different things 0 0  Trouble relaxing 0 1  Restless 2 3  Easily annoyed or irritable 0 1  Afraid - awful might happen 1 0  Total GAD 7 Score 4 5  Anxiety Difficulty Not difficult at all Somewhat difficult      Depression screen [facility] 2/9 01/19/2020 11/10/2019 11/10/2019  Decreased Interest 0 0 0  Down, Depressed, Hopeless 0 0 0  PHQ - 2 Score 0 0 0  Altered sleeping - - -  Tired, decreased energy - - -  Change in appetite - - -  Feeling bad or failure about yourself  - - -  Trouble concentrating - - -  Moving slowly or fidgety/restless - - -  Suicidal thoughts - - -  PHQ-9 Score - - -    History Nikia has a past medical history of Chickenpox, Depression, High blood pressure, and High cholesterol.   He has a past surgical history that includes Hernia repair and Tonsillectomy.   His family history includes Colon cancer in his son; Heart attack in his maternal grandfather; Kidney cancer in his son; Liver cancer in his brother; Stroke in his maternal uncle and paternal uncle.He reports that he has never smoked. He has never used smokeless tobacco. He reports previous alcohol use. He reports that he does not use drugs.    ROS Review of Systems  Constitutional: Negative.   HENT: Negative.   Eyes: Negative for visual disturbance.  Respiratory: Negative for cough and shortness of breath.   Cardiovascular: Negative for chest pain and leg swelling.  Gastrointestinal: Negative for abdominal pain, diarrhea, nausea and vomiting.  Genitourinary: Positive for frequency. Negative for difficulty urinating.  Musculoskeletal: Negative for arthralgias and myalgias.  Skin:  Negative for rash.  Neurological: Positive for dizziness (5 seconds four of th elast six mornings. upon arising. ). Negative for headaches.  Psychiatric/Behavioral: Negative for sleep disturbance.    Objective:  BP 132/81   Pulse 87   Temp 97.6 F (36.4 C) (Temporal)   Resp 20   Ht 5' 6"  (1.676 m)   Wt 206 lb 8 oz (93.7 kg)   SpO2 98%   BMI 33.33 kg/m   BP Readings from Last 3 Encounters:  01/19/20 132/81  10/14/18 (!) 95/53  10/07/18 140/81    Wt Readings from Last 3 Encounters:  01/19/20 206 lb 8 oz (93.7 kg)  11/10/19 204 lb 9.4 oz (92.8 kg)  10/14/18 204 lb 8 oz (92.8 kg)     Physical Exam Constitutional:      General: He is not in acute distress.    Appearance: He is well-developed.  HENT:     Head: Normocephalic and atraumatic.     Right Ear: External ear normal.     Left Ear: External ear normal.     Nose: Nose normal.  Eyes:     Conjunctiva/sclera: Conjunctivae normal.     Pupils: Pupils are equal, round, and reactive to light.  Cardiovascular:     Rate and Rhythm: Normal rate and regular rhythm.     Heart sounds: Normal heart sounds. No murmur.  Pulmonary:     Effort:  Pulmonary effort is normal. No respiratory distress.     Breath sounds: Normal breath sounds. No wheezing or rales.  Abdominal:     Palpations: Abdomen is soft.     Tenderness: There is no abdominal tenderness.  Musculoskeletal:        General: Normal range of motion.     Cervical back: Normal range of motion and neck supple.  Skin:    General: Skin is warm and dry.  Neurological:     Mental Status: He is alert and oriented to person, place, and time.     Deep Tendon Reflexes: Reflexes are normal and symmetric.  Psychiatric:        Behavior: Behavior normal.        Thought Content: Thought content normal.        Judgment: Judgment normal.       Assessment & Plan:   Tommaso was seen today for medical management of chronic issues.  Diagnoses and all orders for this  visit:  Essential hypertension -     CBC with Differential/Platelet -     CMP14+EGFR  Controlled substance agreement signed -     ToxASSURE Select 13 (MW), Urine -     CMP14+EGFR  GAD (generalized anxiety disorder) -     citalopram (CELEXA) 20 MG tablet; Take 1 tablet (20 mg total) by mouth daily. -     clonazePAM (KLONOPIN) 0.5 MG tablet; Take 1 tablet (0.5 mg total) by mouth 2 (two) times daily. -     CMP14+EGFR  Alcoholism ([place]) -     CMP14+EGFR  Mixed hyperlipidemia -     fenofibrate (TRICOR) 48 MG tablet; Take 1 tablet (48 mg total) by mouth daily. -     CBC with Differential/Platelet -     CMP14+EGFR -     Lipid panel  Obstructive sleep apnea syndrome -     CMP14+EGFR  Screening for prostate cancer -     PSA Total (Reflex To Free)  Nephrolithiasis -     Stone analysis -     Calculi, with Photograph  Gastroesophageal reflux disease without esophagitis -     pantoprazole (PROTONIX) 40 MG tablet; Take 1 tablet (40 mg total) by mouth daily.  Other orders -     fexofenadine-pseudoephedrine (ALLEGRA-D 24) 180-240 MG 24 hr tablet; Take 1 tablet by mouth every evening. For allergy and congestion -     terazosin (HYTRIN) 2 MG capsule; Take 1 capsule (2 mg total) by mouth at bedtime.       I have discontinued Vangie Bicker. [place]'s tadalafil and sildenafil. I have also changed his pantoprazole. Additionally, I am having him maintain his b complex vitamins, fluticasone, Prevagen, citalopram, clonazePAM, fenofibrate, fexofenadine-pseudoephedrine, and terazosin.  Allergies as of 01/19/2020   No Known Allergies     Medication List       Accurate as of January 19, 2020 11:59 PM. If you have any questions, ask your nurse or doctor.        STOP taking these medications   sildenafil 20 MG tablet Commonly known as: REVATIO Stopped by: Claretta Fraise, MD   tadalafil 5 MG tablet Commonly known as: CIALIS Stopped by: Claretta Fraise, MD     TAKE these medications   b  complex vitamins tablet Take 1 tablet by mouth daily.   citalopram 20 MG tablet Commonly known as: CELEXA Take 1 tablet (20 mg total) by mouth daily.   clonazePAM 0.5 MG tablet Commonly known as: KLONOPIN Take 1  tablet (0.5 mg total) by mouth 2 (two) times daily.   fenofibrate 48 MG tablet Commonly known as: TRICOR Take 1 tablet (48 mg total) by mouth daily.   fexofenadine-pseudoephedrine 180-240 MG 24 hr tablet Commonly known as: ALLEGRA-D 24 Take 1 tablet by mouth every evening. For allergy and congestion   fluticasone 50 MCG/ACT nasal spray Commonly known as: FLONASE Place 2 sprays into both nostrils 2 (two) times daily.   pantoprazole 40 MG tablet Commonly known as: PROTONIX Take 1 tablet (40 mg total) by mouth daily.   Prevagen 10 MG Caps Generic drug: Apoaequorin Take 1 tablet by mouth daily.   terazosin 2 MG capsule Commonly known as: HYTRIN Take 1 capsule (2 mg total) by mouth at bedtime.        Follow-up: Return in about 6 months (around 07/20/2020).  Claretta Fraise, M.D.

## 2020-01-20 LAB — CBC WITH DIFFERENTIAL/PLATELET
Basophils Absolute: 0 10*3/uL (ref 0.0–0.2)
Basos: 1 %
EOS (ABSOLUTE): 0.1 10*3/uL (ref 0.0–0.4)
Eos: 1 %
Hematocrit: 46.4 % (ref 37.5–51.0)
Hemoglobin: 15.8 g/dL (ref 13.0–17.7)
Immature Grans (Abs): 0 10*3/uL (ref 0.0–0.1)
Immature Granulocytes: 0 %
Lymphocytes Absolute: 0.6 10*3/uL — ABNORMAL LOW (ref 0.7–3.1)
Lymphs: 12 %
MCH: 31.7 pg (ref 26.6–33.0)
MCHC: 34.1 g/dL (ref 31.5–35.7)
MCV: 93 fL (ref 79–97)
Monocytes Absolute: 0.4 10*3/uL (ref 0.1–0.9)
Monocytes: 7 %
Neutrophils Absolute: 4 10*3/uL (ref 1.4–7.0)
Neutrophils: 79 %
Platelets: 121 10*3/uL — ABNORMAL LOW (ref 150–450)
RBC: 4.99 x10E6/uL (ref 4.14–5.80)
RDW: 13.3 % (ref 11.6–15.4)
WBC: 5.1 10*3/uL (ref 3.4–10.8)

## 2020-01-20 LAB — CMP14+EGFR
ALT: 20 IU/L (ref 0–44)
AST: 77 IU/L — ABNORMAL HIGH (ref 0–40)
Albumin/Globulin Ratio: 1.7 (ref 1.2–2.2)
Albumin: 4 g/dL (ref 3.7–4.7)
Alkaline Phosphatase: 92 IU/L (ref 39–117)
BUN/Creatinine Ratio: 4 — ABNORMAL LOW (ref 10–24)
BUN: 4 mg/dL — ABNORMAL LOW (ref 8–27)
Bilirubin Total: 1 mg/dL (ref 0.0–1.2)
CO2: 23 mmol/L (ref 20–29)
Calcium: 9.1 mg/dL (ref 8.6–10.2)
Chloride: 100 mmol/L (ref 96–106)
Creatinine, Ser: 1.09 mg/dL (ref 0.76–1.27)
GFR calc Af Amer: 78 mL/min/{1.73_m2} (ref >59)
GFR calc non Af Amer: 67 mL/min/{1.73_m2} (ref >59)
Globulin, Total: 2.4 g/dL (ref 1.5–4.5)
Glucose: 116 mg/dL — ABNORMAL HIGH (ref 65–99)
Potassium: 4.2 mmol/L (ref 3.5–5.2)
Sodium: 139 mmol/L (ref 134–144)
Total Protein: 6.4 g/dL (ref 6.0–8.5)

## 2020-01-20 LAB — LIPID PANEL
Chol/HDL Ratio: 6.5 ratio — ABNORMAL HIGH (ref 0.0–5.0)
Cholesterol, Total: 258 mg/dL — ABNORMAL HIGH (ref 100–199)
HDL: 40 mg/dL (ref >39)
LDL Chol Calc (NIH): 174 mg/dL — ABNORMAL HIGH (ref 0–99)
Triglycerides: 232 mg/dL — ABNORMAL HIGH (ref 0–149)
VLDL Cholesterol Cal: 44 mg/dL — ABNORMAL HIGH (ref 5–40)

## 2020-01-20 LAB — PSA TOTAL (REFLEX TO FREE): Prostate Specific Ag, Serum: 12.4 ng/mL — ABNORMAL HIGH (ref 0.0–4.0)

## 2020-01-22 LAB — TOXASSURE SELECT 13 (MW), URINE

## 2020-01-23 ENCOUNTER — Encounter: Payer: Self-pay | Admitting: Family Medicine

## 2020-01-25 LAB — CALCULI, WITH PHOTOGRAPH (CLINICAL LAB)
Uric Acid Calculi: 100 %
Weight Calculi: 641 mg

## 2020-01-26 ENCOUNTER — Other Ambulatory Visit: Payer: Self-pay | Admitting: Family Medicine

## 2020-01-26 ENCOUNTER — Telehealth: Payer: Self-pay | Admitting: Family Medicine

## 2020-01-26 DIAGNOSIS — R972 Elevated prostate specific antigen [PSA]: Secondary | ICD-10-CM

## 2020-01-26 MED ORDER — ATORVASTATIN CALCIUM 40 MG PO TABS: 40.0000 mg | ORAL_TABLET | Freq: Every day | ORAL | 3 refills | Status: AC

## 2020-01-26 MED ORDER — POTASSIUM CITRATE ER 10 MEQ (1080 MG) PO TBCR: 20.0000 meq | EXTENDED_RELEASE_TABLET | Freq: Three times a day (TID) | ORAL | 11 refills | Status: AC

## 2020-01-26 NOTE — Telephone Encounter (Signed)
Pt made aware of Calcium level. Also made aware that Potassium Citrate has been called in.

## 2020-01-26 NOTE — Telephone Encounter (Signed)
Pt returned call from [facility] regarding lab results, stating that he is willing to take the Potassium Citrate that Dr Livia Snellen recommends. Needs to know when it will be sent to pharmacy so that he can pick up. Pt also wants his lab work rechecked. He is very concerned about his calcium levels.

## 2020-01-26 NOTE — Telephone Encounter (Signed)
Please review and advise.

## 2020-01-26 NOTE — Telephone Encounter (Signed)
  REFERRAL REQUEST Telephone Note 01/26/2020  What type of referral do you need? Urologist  Have you been seen at our office for this problem?  Yes, Dr Livia Snellen referring  Is there a particular doctor or location that you prefer?  Pt prefers to see Urologist closer to home like in [place], instead of [facility] in [place]  Patient notified that referrals can take up to a week or longer to process. If they haven't heard anything within a week they should call back and speak with the referral department.

## 2020-01-26 NOTE — Telephone Encounter (Signed)
His calcium level is normal, 9.1. I will send in the potassium citrate.

## 2020-02-03 DIAGNOSIS — S93332D Other subluxation of left foot, subsequent encounter: Secondary | ICD-10-CM | POA: Diagnosis not present

## 2020-02-03 DIAGNOSIS — I739 Peripheral vascular disease, unspecified: Secondary | ICD-10-CM | POA: Diagnosis not present

## 2020-02-03 DIAGNOSIS — S93331D Other subluxation of right foot, subsequent encounter: Secondary | ICD-10-CM | POA: Diagnosis not present

## 2020-02-03 DIAGNOSIS — M779 Enthesopathy, unspecified: Secondary | ICD-10-CM | POA: Diagnosis not present

## 2020-02-03 DIAGNOSIS — M79671 Pain in right foot: Secondary | ICD-10-CM | POA: Diagnosis not present

## 2020-02-03 DIAGNOSIS — M79672 Pain in left foot: Secondary | ICD-10-CM | POA: Diagnosis not present

## 2020-02-08 ENCOUNTER — Other Ambulatory Visit: Payer: Self-pay

## 2020-02-08 ENCOUNTER — Encounter (INDEPENDENT_AMBULATORY_CARE_PROVIDER_SITE_OTHER): Payer: Self-pay | Admitting: Ophthalmology

## 2020-02-08 ENCOUNTER — Ambulatory Visit (INDEPENDENT_AMBULATORY_CARE_PROVIDER_SITE_OTHER): Payer: Medicare Other | Admitting: Ophthalmology

## 2020-02-08 DIAGNOSIS — H35371 Puckering of macula, right eye: Secondary | ICD-10-CM

## 2020-02-08 DIAGNOSIS — H353211 Exudative age-related macular degeneration, right eye, with active choroidal neovascularization: Secondary | ICD-10-CM

## 2020-02-08 MED ORDER — BEVACIZUMAB CHEMO INJECTION 1.25MG/0.05ML SYRINGE FOR KALEIDOSCOPE
1.2500 mg | INTRAVITREAL | Status: AC | PRN
  Administered 2020-02-08: 1.25 mg via INTRAVITREAL

## 2020-02-08 NOTE — Progress Notes (Signed)
02/08/2020     CHIEF COMPLAINT Patient presents for Retina Follow Up   HISTORY OF PRESENT ILLNESS: Marvin White is a 74 y.o. male who presents to the clinic today for:   HPI    Retina Follow Up    Patient presents with  Wet AMD.  In right eye.  This started 11 weeks ago.  Severity is mild.  Duration of 11 weeks.  Since onset it is stable.          Comments    11 Week AMD F/U OD, poss Avastin OD  Pt denies noticeable changes to [place] OU since last visit. Pt denies ocular pain, flashes of light, or floaters OU.         Last edited by Rockie Neighbours, [place] on 02/08/2020  1:28 PM. (History)      Referring physician: Claretta Fraise, MD [place],  [place] [address]  HISTORICAL INFORMATION:   Selected notes from the [place]: No current outpatient medications on file. (Ophthalmic Drugs)   No current facility-administered medications for this visit. (Ophthalmic Drugs)   Current Outpatient Medications (Other)  Medication Sig  . Apoaequorin (PREVAGEN) 10 MG CAPS Take 1 tablet by mouth daily.  Marland Kitchen atorvastatin (LIPITOR) 40 MG tablet Take 1 tablet (40 mg total) by mouth daily. For cholesterol  . b complex vitamins tablet Take 1 tablet by mouth daily.  . citalopram (CELEXA) 20 MG tablet Take 1 tablet (20 mg total) by mouth daily.  . clonazePAM (KLONOPIN) 0.5 MG tablet Take 1 tablet (0.5 mg total) by mouth 2 (two) times daily.  . fenofibrate (TRICOR) 48 MG tablet Take 1 tablet (48 mg total) by mouth daily.  . fexofenadine-pseudoephedrine (ALLEGRA-D 24) 180-240 MG 24 hr tablet Take 1 tablet by mouth every evening. For allergy and congestion  . fluticasone (FLONASE) 50 MCG/ACT nasal spray Place 2 sprays into both nostrils 2 (two) times daily.  . pantoprazole (PROTONIX) 40 MG tablet Take 1 tablet (40 mg total) by mouth daily.  . potassium citrate (UROCIT-K) 10 MEQ (1080 MG) SR tablet Take 2 tablets (20 mEq total) by mouth 3 (three) times  daily with meals.  . terazosin (HYTRIN) 2 MG capsule Take 1 capsule (2 mg total) by mouth at bedtime.   No current facility-administered medications for this visit. (Other)      REVIEW OF SYSTEMS:    ALLERGIES No Known Allergies  PAST MEDICAL HISTORY Past Medical History:  Diagnosis Date  . Chickenpox   . Depression   . High blood pressure   . High cholesterol    Past Surgical History:  Procedure Laterality Date  . HERNIA REPAIR     abdominal  1998 2008, inguinal  . TONSILLECTOMY      FAMILY HISTORY Family History  Problem Relation Age of Onset  . Liver cancer Brother   . Stroke Maternal Uncle   . Stroke Paternal Uncle   . Heart attack Maternal Grandfather   . Colon cancer Son   . Kidney cancer Son     SOCIAL HISTORY Social History   Tobacco Use  . Smoking status: Never Smoker  . Smokeless tobacco: Never Used  Substance Use Topics  . Alcohol use: Not Currently    Comment: quit 07/27/2016  . Drug use: No         OPHTHALMIC EXAM:  Base Eye Exam    Visual Acuity (ETDRS)      Right  Left   Dist cc 20/25 -1 20/30   Dist ph cc  20/25 -1   Correction: Glasses       Tonometry (Tonopen, 1:33 PM)      Right Left   Pressure 13 14       Pupils      Pupils Dark Light Shape React APD   Right PERRL 4 3 Round Brisk None   Left PERRL 4 3 Round Brisk None       Visual Fields (Counting fingers)      Left Right    Full Full       Extraocular Movement      Right Left    Full Full       Neuro/Psych    Oriented x3: Yes   Mood/Affect: Normal       Dilation    Right eye: 1.0% Mydriacyl, 2.5% Phenylephrine @ 1:33 PM        Slit Lamp and Fundus Exam    External Exam      Right Left   External Normal Normal       Slit Lamp Exam      Right Left   Lids/Lashes Normal Normal   Conjunctiva/Sclera White and quiet White and quiet   Cornea Clear Clear   Anterior Chamber Deep and quiet Deep and quiet   Iris Round and reactive Round and reactive    Lens 2+ Nuclear sclerosis 2+ Nuclear sclerosis   Anterior Vitreous Normal Normal       Fundus Exam      Right Left   Posterior Vitreous Posterior vitreous detachment    Disc Normal    C/D Ratio 0.25    Macula Epiretinal membrane, Retinal pigment epithelial atrophy, Retinal atrophy, Hard drusen, no exudates, no hemorrhage    Vessels Normal    Periphery Normal           IMAGING AND PROCEDURES  Imaging and Procedures for 02/08/20  OCT, Retina - OU - Both Eyes       Right Eye Central Foveal Thickness: 368. Progression has been stable. Findings include abnormal foveal contour, retinal drusen , epiretinal membrane.   Left Eye Central Foveal Thickness: 334. Progression has been stable.   Notes OD, with large drusen or deposits.  Much less active CNVM.  Membrane with straightening of the retina nasally and into the FAZ.  OD currently at 11-week follow-up.  Will repeat injection today and I will with history of recurrences  OS now 6 weeks status post intravitreal Avastin.  Looks great OS       Intravitreal Injection, Pharmacologic Agent - OD - Right Eye       Time Out 02/08/2020. 2:26 PM. Confirmed correct patient, procedure, site, and patient consented.   Anesthesia Anesthetic medications included Akten 3.5%.   Procedure Preparation included Tobramycin 0.3%, Ofloxacin , 10% betadine to eyelids. A 30 gauge needle was used.   Injection:  1.25 mg Bevacizumab (AVASTIN) SOLN   NDC: [id number], [id number]   Route: Intravitreal, Site: Right Eye, Waste: 0 mg  Post-op Post injection exam found visual acuity of at least counting fingers. The patient tolerated the procedure well. There were no complications. The patient received written and verbal post procedure care education. Post injection medications were not given.                 ASSESSMENT/PLAN:  Exudative age-related macular degeneration of right eye with active choroidal neovascularization (HCC) The nature  of wet macular degeneration  was discussed with the patient.  Forms of therapy reviewed include the use of Anti-VEGF medications injected painlessly into the eye, as well as other possible treatment modalities, including thermal laser therapy. Fellow eye involvement and risks were discussed with the patient. Upon the finding of wet age related macular degeneration, treatment will be offered. The treatment regimen is on a treat as needed basis with the intent to treat if necessary and extend interval of exams when possible. On average 1 out of 6 patients do not need lifetime therapy. However, the risk of recurrent disease is high for a lifetime.  Initially monthly, then periodic, examinations and evaluations will determine whether the next treatment is required on the day of the examination.  OD, still improved now at 11-week exam interval.  We will repeat intravitreal Avastin today and extend interval to 18-month exam.  Macular pucker, right eye The nature of macular pucker (epiretinal membrane ERM) was discussed with the patient as well as threshold criteria for vitrectomy surgery. I explained that in rare cases another surgery is needed to actually remove a second wrinkle should it regrow.  Most often, the epiretinal membrane and underlying wrinkled internal limiting membrane are removed with the first surgery, to accomplish the goals.   If the operative eye is [place] (natural lens still present), cataract surgery is often recommended prior to Vitrectomy. This will enable the retina surgeon to have the best view during surgery and the patient to obtain optimal results in the future. Treatment options were discussed.  OD Minor, with good visual acuity was still observe      ICD-10-CM   1. Exudative age-related macular degeneration of right eye with active choroidal neovascularization (HCC)  H35.3211 OCT, Retina - OU - Both Eyes    Intravitreal Injection, Pharmacologic Agent - OD - Right Eye    Bevacizumab  (AVASTIN) SOLN 1.25 mg  2. Macular pucker, right eye  H35.371 Intravitreal Injection, Pharmacologic Agent - OD - Right Eye    Bevacizumab (AVASTIN) SOLN 1.25 mg    1.  2.  3.  Ophthalmic Meds Ordered this visit:  Meds ordered this encounter  Medications  . Bevacizumab (AVASTIN) SOLN 1.25 mg       Return in about 3 months (around 05/10/2020) for OD, AVASTIN OCT.  There are no Patient Instructions on file for this visit.   Explained the diagnoses, plan, and follow up with the patient and they expressed understanding.  Patient expressed understanding of the importance of proper follow up care.   Clent Demark Alani Sabbagh M.D. Diseases & Surgery of the Retina and Vitreous [facility] [place] 02/08/20     Abbreviations: M myopia (nearsighted); A astigmatism; H hyperopia (farsighted); P presbyopia; Mrx spectacle prescription;  CTL contact lenses; OD right eye; OS left eye; OU both eyes  XT exotropia; ET esotropia; PEK punctate epithelial keratitis; PEE punctate epithelial erosions; DES dry eye syndrome; MGD meibomian gland dysfunction; ATs artificial tears; PFAT's preservative free artificial tears; [place] nuclear sclerotic cataract; PSC posterior subcapsular cataract; ERM epi-retinal membrane; PVD posterior vitreous detachment; RD retinal detachment; DM diabetes mellitus; DR diabetic retinopathy; NPDR non-proliferative diabetic retinopathy; PDR proliferative diabetic retinopathy; CSME clinically significant macular edema; DME diabetic macular edema; dbh dot blot hemorrhages; CWS cotton wool spot; POAG primary open angle glaucoma; C/D cup-to-disc ratio; HVF humphrey visual field; GVF goldmann visual field; OCT optical coherence tomography; IOP intraocular pressure; BRVO Branch retinal vein occlusion; CRVO central retinal vein occlusion; CRAO central retinal artery occlusion; BRAO branch retinal  artery occlusion; RT retinal tear; SB scleral buckle; PPV pars plana vitrectomy; VH Vitreous  hemorrhage; PRP panretinal laser photocoagulation; IVK intravitreal kenalog; VMT vitreomacular traction; MH Macular hole;  NVD neovascularization of the disc; NVE neovascularization elsewhere; AREDS age related eye disease study; ARMD age related macular degeneration; POAG primary open angle glaucoma; EBMD epithelial/anterior basement membrane dystrophy; ACIOL anterior chamber intraocular lens; IOL intraocular lens; PCIOL posterior chamber intraocular lens; Phaco/IOL phacoemulsification with intraocular lens placement; [place] photorefractive keratectomy; LASIK laser assisted in situ keratomileusis; HTN hypertension; DM diabetes mellitus; COPD chronic obstructive pulmonary disease

## 2020-02-08 NOTE — Assessment & Plan Note (Signed)
The nature of macular pucker (epiretinal membrane ERM) was discussed with the patient as well as threshold criteria for vitrectomy surgery. I explained that in rare cases another surgery is needed to actually remove a second wrinkle should it regrow.  Most often, the epiretinal membrane and underlying wrinkled internal limiting membrane are removed with the first surgery, to accomplish the goals.   If the operative eye is Phak[place] (natural lens still present), cataract surgery is often recommended prior to Vitrectomy. This will enable the retina surgeon to have the best view during surgery and the patient to obtain optimal results in the future. Treatment options were discussed.  OD Minor, with good visual acuity was still observe

## 2020-02-08 NOTE — Assessment & Plan Note (Signed)
The nature of wet macular degeneration was discussed with the patient.  Forms of therapy reviewed include the use of Anti-VEGF medications injected painlessly into the eye, as well as other possible treatment modalities, including thermal laser therapy. Fellow eye involvement and risks were discussed with the patient. Upon the finding of wet age related macular degeneration, treatment will be offered. The treatment regimen is on a treat as needed basis with the intent to treat if necessary and extend interval of exams when possible. On average 1 out of 6 patients do not need lifetime therapy. However, the risk of recurrent disease is high for a lifetime.  Initially monthly, then periodic, examinations and evaluations will determine whether the next treatment is required on the day of the examination.  OD, still improved now at 11-week exam interval.  We will repeat intravitreal Avastin today and extend interval to 81-month exam.

## 2020-02-14 ENCOUNTER — Encounter (INDEPENDENT_AMBULATORY_CARE_PROVIDER_SITE_OTHER): Payer: Self-pay | Admitting: Ophthalmology

## 2020-02-14 ENCOUNTER — Other Ambulatory Visit: Payer: Self-pay

## 2020-02-14 ENCOUNTER — Ambulatory Visit (INDEPENDENT_AMBULATORY_CARE_PROVIDER_SITE_OTHER): Payer: Medicare Other | Admitting: Ophthalmology

## 2020-02-14 DIAGNOSIS — H353221 Exudative age-related macular degeneration, left eye, with active choroidal neovascularization: Secondary | ICD-10-CM | POA: Diagnosis not present

## 2020-02-14 DIAGNOSIS — H35372 Puckering of macula, left eye: Secondary | ICD-10-CM | POA: Diagnosis not present

## 2020-02-14 DIAGNOSIS — H353211 Exudative age-related macular degeneration, right eye, with active choroidal neovascularization: Secondary | ICD-10-CM | POA: Diagnosis not present

## 2020-02-14 DIAGNOSIS — H2512 Age-related nuclear cataract, left eye: Secondary | ICD-10-CM | POA: Diagnosis not present

## 2020-02-14 MED ORDER — BEVACIZUMAB CHEMO INJECTION 1.25MG/0.05ML SYRINGE FOR KALEIDOSCOPE
1.2500 mg | INTRAVITREAL | Status: AC | PRN
  Administered 2020-02-14: 1.25 mg via INTRAVITREAL

## 2020-02-14 NOTE — Assessment & Plan Note (Signed)
Okay to proceed with CE IOL OS at any time

## 2020-02-14 NOTE — Assessment & Plan Note (Signed)
The nature of wet macular degeneration was discussed with the patient.  Forms of therapy reviewed include the use of Anti-VEGF medications injected painlessly into the eye, as well as other possible treatment modalities, including thermal laser therapy. Fellow eye involvement and risks were discussed with the patient. Upon the finding of wet age related macular degeneration, treatment will be offered. The treatment regimen is on a treat as needed basis with the intent to treat if necessary and extend interval of exams when possible. On average 1 out of 6 patients do not need lifetime therapy. However, the risk of recurrent disease is high for a lifetime.  Initially monthly, then periodic, examinations and evaluations will determine whether the next treatment is required on the day of the examination.  OS, much improved currently at 7-week interval of exam.  We will repeat intravitreal Avastin OS today and extend to 8 weeks next

## 2020-02-14 NOTE — Progress Notes (Signed)
02/14/2020     CHIEF COMPLAINT Patient presents for Retina Follow Up   HISTORY OF PRESENT ILLNESS: Marvin White is a 74 y.o. male who presents to the clinic today for:   HPI    Retina Follow Up    Patient presents with  Wet AMD.  In left eye.  Severity is moderate.  Duration of 7 weeks.  Since onset it is stable.  I, the attending physician,  performed the HPI with the patient and updated documentation appropriately.          Comments    7 Week AMD f\u OS. Possible Avastin OS. OCT  Pt states no changes in vision since last visit.       Last edited by Tilda Franco on 02/14/2020  1:34 PM. (History)      Referring physician: Claretta Fraise, MD [place],  [place] [id number]  HISTORICAL INFORMATION:   Selected notes from the MEDICAL RECORD NUMBER       CURRENT MEDICATIONS: No current outpatient medications on file. (Ophthalmic Drugs)   No current facility-administered medications for this visit. (Ophthalmic Drugs)   Current Outpatient Medications (Other)  Medication Sig  . Apoaequorin (PREVAGEN) 10 MG CAPS Take 1 tablet by mouth daily.  Marland Kitchen atorvastatin (LIPITOR) 40 MG tablet Take 1 tablet (40 mg total) by mouth daily. For cholesterol  . b complex vitamins tablet Take 1 tablet by mouth daily.  . citalopram (CELEXA) 20 MG tablet Take 1 tablet (20 mg total) by mouth daily.  . clonazePAM (KLONOPIN) 0.5 MG tablet Take 1 tablet (0.5 mg total) by mouth 2 (two) times daily.  . fenofibrate (TRICOR) 48 MG tablet Take 1 tablet (48 mg total) by mouth daily.  . fexofenadine-pseudoephedrine (ALLEGRA-D 24) 180-240 MG 24 hr tablet Take 1 tablet by mouth every evening. For allergy and congestion  . fluticasone (FLONASE) 50 MCG/ACT nasal spray Place 2 sprays into both nostrils 2 (two) times daily.  . pantoprazole (PROTONIX) 40 MG tablet Take 1 tablet (40 mg total) by mouth daily.  . potassium citrate (UROCIT-K) 10 MEQ (1080 MG) SR tablet Take 2 tablets (20 mEq total) by  mouth 3 (three) times daily with meals.  . terazosin (HYTRIN) 2 MG capsule Take 1 capsule (2 mg total) by mouth at bedtime.   No current facility-administered medications for this visit. (Other)      REVIEW OF SYSTEMS:    ALLERGIES No Known Allergies  PAST MEDICAL HISTORY Past Medical History:  Diagnosis Date  . Chickenpox   . Depression   . High blood pressure   . High cholesterol    Past Surgical History:  Procedure Laterality Date  . HERNIA REPAIR     abdominal  1998 2008, inguinal  . TONSILLECTOMY      FAMILY HISTORY Family History  Problem Relation Age of Onset  . Liver cancer Brother   . Stroke Maternal Uncle   . Stroke Paternal Uncle   . Heart attack Maternal Grandfather   . Colon cancer Son   . Kidney cancer Son     SOCIAL HISTORY Social History   Tobacco Use  . Smoking status: Never Smoker  . Smokeless tobacco: Never Used  Substance Use Topics  . Alcohol use: Not Currently    Comment: quit 07/27/2016  . Drug use: No         OPHTHALMIC EXAM:  Base Eye Exam    Visual Acuity (Snellen - Linear)      Right  Left   Dist cc 20/20 20/30       Tonometry (Tonopen, 1:40 PM)      Right Left   Pressure 10 14       Pupils      Pupils Dark Light Shape React APD   Right PERRL 3 2 Round Brisk None   Left PERRL 3.5 2 Round Brisk None       Visual Fields (Counting fingers)      Left Right    Full Full       Neuro/Psych    Oriented x3: Yes   Mood/Affect: Normal       Dilation    Left eye: 1.0% Mydriacyl, 2.5% Phenylephrine @ 1:40 PM        Slit Lamp and Fundus Exam    External Exam      Right Left   External Normal Normal       Slit Lamp Exam      Right Left   Lids/Lashes Normal Normal   Conjunctiva/Sclera White and quiet White and quiet   Cornea Clear Clear   Anterior Chamber Deep and quiet Deep and quiet   Iris Round and reactive Round and reactive   Lens 2+ Nuclear sclerosis 2+ Nuclear sclerosis   Anterior Vitreous Normal  Normal       Fundus Exam      Right Left   Posterior Vitreous Normal Normal   Disc  Normal   C/D Ratio  0.3   Macula  Age related macular degeneration, Early age related macular degeneration,  Choroidal neovascular membrane, Retinal pigment epithelial mottling   Vessels  Normal   Periphery  Normal          IMAGING AND PROCEDURES  Imaging and Procedures for 02/14/20  OCT, Retina - OU - Both Eyes       Right Eye Quality was good. Scan locations included subfoveal. Central Foveal Thickness: 359. Findings include retinal drusen , abnormal foveal contour, epiretinal membrane.   Left Eye Quality was good. Scan locations included subfoveal. Central Foveal Thickness: 321. Progression has improved. Findings include abnormal foveal contour, epiretinal membrane.   Notes OD, much less active CN VM.  ERM not visually significant.  OS today at 7-week interval.  Less retinal thickening superiorly, less CME.       Intravitreal Injection, Pharmacologic Agent - OS - Left Eye       Time Out 02/14/2020. 3:02 PM. Confirmed correct patient, procedure, site, and patient consented.   Anesthesia Topical anesthesia was used. Anesthetic medications included Akten 3.5%.   Procedure Preparation included Ofloxacin , 10% betadine to eyelids, 5% betadine to ocular surface.   Injection:  1.25 mg Bevacizumab (AVASTIN) SOLN   NDC: [id number], [id number]   Route: Intravitreal, Site: Left Eye, Waste: 0 mg  Post-op Post injection exam found visual acuity of at least counting fingers. The patient tolerated the procedure well. There were no complications. The patient received written and verbal post procedure care education. Post injection medications were not given.                 ASSESSMENT/PLAN:  Exudative age-related macular degeneration of left eye with active choroidal neovascularization (HCC) The nature of wet macular degeneration was discussed with the patient.  Forms of therapy  reviewed include the use of Anti-VEGF medications injected painlessly into the eye, as well as other possible treatment modalities, including thermal laser therapy. Fellow eye involvement and risks were discussed with the patient. Upon the finding  of wet age related macular degeneration, treatment will be offered. The treatment regimen is on a treat as needed basis with the intent to treat if necessary and extend interval of exams when possible. On average 1 out of 6 patients do not need lifetime therapy. However, the risk of recurrent disease is high for a lifetime.  Initially monthly, then periodic, examinations and evaluations will determine whether the next treatment is required on the day of the examination.  OS, much improved currently at 7-week interval of exam.  We will repeat intravitreal Avastin OS today and extend to 8 weeks next  Nuclear sclerotic cataract of left eye Okay to proceed with CE IOL OS at any time      ICD-10-CM   1. Exudative age-related macular degeneration of left eye with active choroidal neovascularization (HCC)  H35.3221 OCT, Retina - OU - Both Eyes    Intravitreal Injection, Pharmacologic Agent - OS - Left Eye    Bevacizumab (AVASTIN) SOLN 1.25 mg  2. Exudative age-related macular degeneration of right eye with active choroidal neovascularization (HCC)  H35.3211 OCT, Retina - OU - Both Eyes  3. Macular pucker, left eye  H35.372   4. Nuclear sclerotic cataract of left eye  H25.12     1.  Repeat intravitreal Avastin OS today.  Currently 7-week interval.  Disease is less active .  We will repeat examination OS in 8 weeks possible intravitreal Avastin  2.  OD, next visit as scheduled possible Avastin  3.  Ophthalmic Meds Ordered this visit:  Meds ordered this encounter  Medications  . Bevacizumab (AVASTIN) SOLN 1.25 mg       Return in about 8 weeks (around 04/10/2020) for dilate, OS, AVASTIN OCT.  There are no Patient Instructions on file for this  visit.   Explained the diagnoses, plan, and follow up with the patient and they expressed understanding.  Patient expressed understanding of the importance of proper follow up care.   Clent Demark Hanford Lust M.D. Diseases & Surgery of the Retina and Vitreous [facility] [place] 02/14/20     Abbreviations: M myopia (nearsighted); A astigmatism; H hyperopia (farsighted); P presbyopia; Mrx spectacle prescription;  CTL contact lenses; OD right eye; OS left eye; OU both eyes  XT exotropia; ET esotropia; PEK punctate epithelial keratitis; PEE punctate epithelial erosions; DES dry eye syndrome; MGD meibomian gland dysfunction; ATs artificial tears; PFAT's preservative free artificial tears; [place] nuclear sclerotic cataract; PSC posterior subcapsular cataract; ERM epi-retinal membrane; PVD posterior vitreous detachment; RD retinal detachment; DM diabetes mellitus; DR diabetic retinopathy; NPDR non-proliferative diabetic retinopathy; PDR proliferative diabetic retinopathy; CSME clinically significant macular edema; DME diabetic macular edema; dbh dot blot hemorrhages; CWS cotton wool spot; POAG primary open angle glaucoma; C/D cup-to-disc ratio; HVF humphrey visual field; GVF goldmann visual field; OCT optical coherence tomography; IOP intraocular pressure; BRVO Branch retinal vein occlusion; CRVO central retinal vein occlusion; CRAO central retinal artery occlusion; BRAO branch retinal artery occlusion; RT retinal tear; SB scleral buckle; PPV pars plana vitrectomy; VH Vitreous hemorrhage; PRP panretinal laser photocoagulation; IVK intravitreal kenalog; VMT vitreomacular traction; MH Macular hole;  NVD neovascularization of the disc; NVE neovascularization elsewhere; AREDS age related eye disease study; ARMD age related macular degeneration; POAG primary open angle glaucoma; EBMD epithelial/anterior basement membrane dystrophy; ACIOL anterior chamber intraocular lens; IOL intraocular lens; PCIOL posterior chamber  intraocular lens; Phaco/IOL phacoemulsification with intraocular lens placement; [place] photorefractive keratectomy; LASIK laser assisted in situ keratomileusis; HTN hypertension; DM diabetes mellitus; COPD chronic obstructive  pulmonary disease

## 2020-02-24 ENCOUNTER — Other Ambulatory Visit: Payer: Self-pay

## 2020-02-24 ENCOUNTER — Ambulatory Visit (INDEPENDENT_AMBULATORY_CARE_PROVIDER_SITE_OTHER): Payer: Medicare Other | Admitting: Family Medicine

## 2020-02-24 ENCOUNTER — Encounter: Payer: Self-pay | Admitting: Family Medicine

## 2020-02-24 VITALS — BP 132/86 | HR 94 | Ht 66.0 in | Wt 206.0 lb

## 2020-02-24 DIAGNOSIS — G4733 Obstructive sleep apnea (adult) (pediatric): Secondary | ICD-10-CM

## 2020-02-24 DIAGNOSIS — Z9989 Dependence on other enabling machines and devices: Secondary | ICD-10-CM | POA: Diagnosis not present

## 2020-02-24 NOTE — Progress Notes (Signed)
PATIENT: Marvin MCCLEARY DOB: 02/18/1946  REASON FOR VISIT: follow up HISTORY FROM: patient  Chief Complaint  Patient presents with  . Follow-up    cpap fu, alone, rm 7 pt states he is doing well on cpap     HISTORY OF PRESENT ILLNESS: Today 02/24/20 Marvin White is a 74 y.o. male here today for follow up for OSA on CPAP. He is doing well. He is using CPAP nightly. He also uses it if he naps. He does feel that he rests better using CPAP. He denies concerns with machine or supplies.   Compliance report dated 01/24/2020 through 02/22/2020 reveals that he used CPAP 30 of the past 30 days for compliance of 100%.  He was 100% compliant with 4-hour usage.  Average usage was 7 hours and 56 minutes.  Residual AHI is 2.1 on 5 to 10 cm of water and an EPR of 3.  There was no significant leak noted.  HISTORY: (copied from my note on 08/26/2019)  JSON Marvin White is a 74 y.o. male here today for follow up for OSA on CPAP.  He continues to adjust to CPAP therapy.  He reports he is now using CPAP nightly.  He continues to adjust humidity settings for comfort.  He has not noted any improvement in daytime energy, however, does note that he is sleeping better.  He does not wake up as frequently at night.  Compliance report dated 07/26/2019 through 08/24/2019 reveals that he is using CPAP every night for compliance of 100%.  Every night he uses CPAP greater than 4 hours for compliance of 100%.  On average he uses CPAP about 8 hours.  Residual AHI is 1.5 on 5 to 10 cm of water and an EPR of 3.  There was no significant leak noted.   REVIEW OF SYSTEMS: Out of a complete 14 system review of symptoms, fatigue the patient complains only of the following symptoms, fatigue and all other reviewed systems are negative.  ESS: 5  ALLERGIES: No Known Allergies  HOME MEDICATIONS: Outpatient Medications Prior to Visit  Medication Sig Dispense Refill  . Apoaequorin (PREVAGEN) 10 MG CAPS Take 1 tablet by mouth  daily.    Marland Kitchen atorvastatin (LIPITOR) 40 MG tablet Take 1 tablet (40 mg total) by mouth daily. For cholesterol 90 tablet 3  . b complex vitamins tablet Take 1 tablet by mouth daily.    . citalopram (CELEXA) 20 MG tablet Take 1 tablet (20 mg total) by mouth daily. 90 tablet 1  . clonazePAM (KLONOPIN) 0.5 MG tablet Take 1 tablet (0.5 mg total) by mouth 2 (two) times daily. 60 tablet 5  . fenofibrate (TRICOR) 48 MG tablet Take 1 tablet (48 mg total) by mouth daily. 90 tablet 1  . fexofenadine-pseudoephedrine (ALLEGRA-D 24) 180-240 MG 24 hr tablet Take 1 tablet by mouth every evening. For allergy and congestion 30 tablet 11  . fluticasone (FLONASE) 50 MCG/ACT nasal spray Place 2 sprays into both nostrils 2 (two) times daily.    . pantoprazole (PROTONIX) 40 MG tablet Take 1 tablet (40 mg total) by mouth daily. 90 tablet 1  . potassium citrate (UROCIT-K) 10 MEQ (1080 MG) SR tablet Take 2 tablets (20 mEq total) by mouth 3 (three) times daily with meals. 180 tablet 11  . terazosin (HYTRIN) 2 MG capsule Take 1 capsule (2 mg total) by mouth at bedtime. 90 capsule 1   No facility-administered medications prior to visit.    PAST MEDICAL HISTORY:  Past Medical History:  Diagnosis Date  . Chickenpox   . Depression   . High blood pressure   . High cholesterol     PAST SURGICAL HISTORY: Past Surgical History:  Procedure Laterality Date  . HERNIA REPAIR     abdominal  1998 2008, inguinal  . TONSILLECTOMY      FAMILY HISTORY: Family History  Problem Relation Age of Onset  . Liver cancer Brother   . Stroke Maternal Uncle   . Stroke Paternal Uncle   . Heart attack Maternal Grandfather   . Colon cancer Son   . Kidney cancer Son     SOCIAL HISTORY: Social History   Socioeconomic History  . Marital status: Married    Spouse name: Not on file  . Number of children: Not on file  . Years of education: Not on file  . Highest education level: Not on file  Occupational History  . Not on file    Tobacco Use  . Smoking status: Never Smoker  . Smokeless tobacco: Never Used  Substance and Sexual Activity  . Alcohol use: Not Currently    Comment: quit 07/27/2016  . Drug use: No  . Sexual activity: Yes  Other Topics Concern  . Not on file  Social History Narrative  . Not on file   Social Determinants of Health   Financial Resource Strain: Low Risk   . Difficulty of Paying Living Expenses: Not hard at all  Food Insecurity: No Food Insecurity  . Worried About Charity fundraiser in the Last Year: Never true  . Ran Out of Food in the Last Year: Never true  Transportation Needs: No Transportation Needs  . Lack of Transportation (Medical): No  . Lack of Transportation (Non-Medical): No  Physical Activity: Inactive  . Days of Exercise per Week: 0 days  . Minutes of Exercise per Session: 0 min  Stress: No Stress Concern Present  . Feeling of Stress : Only a little  Social Connections: Not Isolated  . Frequency of Communication with Friends and Family: More than three times a week  . Frequency of Social Gatherings with Friends and Family: More than three times a week  . Attends Religious Services: More than 4 times per year  . Active Member of Clubs or Organizations: Yes  . Attends Archivist Meetings: More than 4 times per year  . Marital Status: Married  Human resources officer Violence: Not At Risk  . Fear of Current or Ex-Partner: No  . Emotionally Abused: No  . Physically Abused: No  . Sexually Abused: No      PHYSICAL EXAM  Vitals:   02/24/20 1313  BP: 132/86  Pulse: 94  Weight: 206 lb (93.4 kg)  Height: 5\' 6"  (1.676 m)   Body mass index is 33.25 kg/m.  Generalized: Well developed, in no acute distress  Cardiology: normal rate and rhythm, no murmur noted Respiratory: clear to auscultation bilaterally  Neurological examination  Mentation: Alert oriented to time, place, history taking. Follows all commands speech and language fluent Cranial nerve  II-XII: Pupils were equal round reactive to light. Extraocular movements were full, visual field were full  Motor: The motor testing reveals 5 over 5 strength of all 4 extremities. Good symmetric motor tone is noted throughout.  Gait and station: Gait is normal.   DIAGNOSTIC DATA (LABS, IMAGING, TESTING) - I reviewed patient records, labs, notes, testing and imaging myself where available.  No flowsheet data found.   Lab Results  Component  Value Date   WBC 5.1 01/19/2020   HGB 15.8 01/19/2020   HCT 46.4 01/19/2020   MCV 93 01/19/2020   PLT 121 (L) 01/19/2020      Component Value Date/Time   NA 139 01/19/2020 1041   K 4.2 01/19/2020 1041   CL 100 01/19/2020 1041   CO2 23 01/19/2020 1041   GLUCOSE 116 (H) 01/19/2020 1041   BUN 4 (L) 01/19/2020 1041   CREATININE 1.09 01/19/2020 1041   CALCIUM 9.1 01/19/2020 1041   PROT 6.4 01/19/2020 1041   ALBUMIN 4.0 01/19/2020 1041   AST 77 (H) 01/19/2020 1041   ALT 20 01/19/2020 1041   ALKPHOS 92 01/19/2020 1041   BILITOT 1.0 01/19/2020 1041   GFRNONAA 67 01/19/2020 1041   GFRAA 78 01/19/2020 1041   Lab Results  Component Value Date   CHOL 258 (H) 01/19/2020   HDL 40 01/19/2020   LDLCALC 174 (H) 01/19/2020   TRIG 232 (H) 01/19/2020   CHOLHDL 6.5 (H) 01/19/2020   No results found for: HGBA1C No results found for: VITAMINB12 No results found for: TSH     ASSESSMENT AND PLAN 74 y.o. year old male  has a past medical history of Chickenpox, Depression, High blood pressure, and High cholesterol. here with     ICD-10-CM   1. OSA on CPAP  G47.33 For home use only DME continuous positive airway pressure (CPAP)   Z99.89      Mr Barder is doing well on CPAP therapy.  Compliance report reveals excellent compliance.  He was encouraged to continue using CPAP nightly and for greater than 4 hours each night.  Supply order sent to DME.  Healthy lifestyle habits with well-balanced diet, regular exercise and adequate hydration encouraged.  He  will follow-up with [place] in 1 year, sooner if needed.  He verbalizes understanding and agreement with this plan.   Orders Placed This Encounter  Procedures  . For home use only DME continuous positive airway pressure (CPAP)    Supplies    Order Specific Question:   Length of Need    Answer:   Lifetime    Order Specific Question:   Patient has OSA or probable OSA    Answer:   Yes    Order Specific Question:   Is the patient currently using CPAP in the home    Answer:   Yes    Order Specific Question:   Settings    Answer:   Other see comments    Order Specific Question:   CPAP supplies needed    Answer:   Mask, headgear, cushions, filters, heated tubing and water chamber     No orders of the defined types were placed in this encounter.     I spent 15 minutes with the patient. 50% of this time was spent counseling and educating patient on plan of care and medications.    Debbora Presto, FNP-C 02/24/2020, 1:40 PM [facility] [address], [place], [place] [address] [phone]

## 2020-02-24 NOTE — Patient Instructions (Signed)
Please continue using your CPAP regularly. While your insurance requires that you use CPAP at least 4 hours each night on 70% of the nights, I recommend, that you not skip any nights and use it throughout the night if you can. Getting used to CPAP and staying with the treatment long term does take time and patience and discipline. Untreated obstructive sleep apnea when it is moderate to severe can have an adverse impact on cardiovascular health and raise her risk for heart disease, arrhythmias, hypertension, congestive heart failure, stroke and diabetes. Untreated obstructive sleep apnea causes sleep disruption, nonrestorative sleep, and sleep deprivation. This can have an impact on your day to day functioning and cause daytime sleepiness and impairment of cognitive function, memory loss, mood disturbance, and problems focussing. Using CPAP regularly can improve these symptoms.   Follow up in 1 year   Sleep Apnea Sleep apnea affects breathing during sleep. It causes breathing to stop for a short time or to become shallow. It can also increase the risk of:  Heart attack.  Stroke.  Being very overweight (obese).  Diabetes.  Heart failure.  Irregular heartbeat. The goal of treatment is to help you breathe normally again. What are the causes? There are three kinds of sleep apnea:  Obstructive sleep apnea. This is caused by a blocked or collapsed airway.  Central sleep apnea. This happens when the brain does not send the right signals to the muscles that control breathing.  Mixed sleep apnea. This is a combination of obstructive and central sleep apnea. The most common cause of this condition is a collapsed or blocked airway. This can happen if:  Your throat muscles are too relaxed.  Your tongue and tonsils are too large.  You are overweight.  Your airway is too small. What increases the risk?  Being overweight.  Smoking.  Having a small airway.  Being older.  Being  male.  Drinking alcohol.  Taking medicines to calm yourself (sedatives or tranquilizers).  Having family members with the condition. What are the signs or symptoms?  Trouble staying asleep.  Being sleepy or tired during the day.  Getting angry a lot.  Loud snoring.  Headaches in the morning.  Not being able to focus your mind (concentrate).  Forgetting things.  Less interest in sex.  Mood swings.  Personality changes.  Feelings of sadness (depression).  Waking up a lot during the night to pee (urinate).  Dry mouth.  Sore throat. How is this diagnosed?  Your medical history.  A physical exam.  A test that is done when you are sleeping (sleep study). The test is most often done in a sleep lab but may also be done at home. How is this treated?   Sleeping on your side.  Using a medicine to get rid of mucus in your nose (decongestant).  Avoiding the use of alcohol, medicines to help you relax, or certain pain medicines (narcotics).  Losing weight, if needed.  Changing your diet.  Not smoking.  Using a machine to open your airway while you sleep, such as: ? An oral appliance. This is a mouthpiece that shifts your lower jaw forward. ? A CPAP device. This device blows air through a mask when you breathe out (exhale). ? An EPAP device. This has valves that you put in each nostril. ? A BPAP device. This device blows air through a mask when you breathe in (inhale) and breathe out.  Having surgery if other treatments do not work. It is   important to get treatment for sleep apnea. Without treatment, it can lead to:  High blood pressure.  Coronary artery disease.  In men, not being able to have an erection (impotence).  Reduced thinking ability. Follow these instructions at home: Lifestyle  Make changes that your doctor recommends.  Eat a healthy diet.  Lose weight if needed.  Avoid alcohol, medicines to help you relax, and some pain  medicines.  Do not use any products that contain nicotine or tobacco, such as cigarettes, e-cigarettes, and chewing tobacco. If you need help quitting, ask your doctor. General instructions  Take over-the-counter and prescription medicines only as told by your doctor.  If you were given a machine to use while you sleep, use it only as told by your doctor.  If you are having surgery, make sure to tell your doctor you have sleep apnea. You may need to bring your device with you.  Keep all follow-up visits as told by your doctor. This is important. Contact a doctor if:  The machine that you were given to use during sleep bothers you or does not seem to be working.  You do not get better.  You get worse. Get help right away if:  Your chest hurts.  You have trouble breathing in enough air.  You have an uncomfortable feeling in your back, arms, or stomach.  You have trouble talking.  One side of your body feels weak.  A part of your face is hanging down. These symptoms may be an emergency. Do not wait to see if the symptoms will go away. Get medical help right away. Call your local emergency services (911 in the [place] Do not drive yourself to the hospital. Summary  This condition affects breathing during sleep.  The most common cause is a collapsed or blocked airway.  The goal of treatment is to help you breathe normally while you sleep. This information is not intended to replace advice given to you by your health care provider. Make sure you discuss any questions you have with your health care provider. Document Revised: 06/26/2018 Document Reviewed: 05/05/2018 Elsevier Patient Education  2020 [employer]  

## 2020-02-24 NOTE — Progress Notes (Signed)
cpap orders have been faxed to [facility]

## 2020-03-21 ENCOUNTER — Other Ambulatory Visit: Payer: Self-pay

## 2020-03-21 ENCOUNTER — Encounter: Payer: Self-pay | Admitting: Urology

## 2020-03-21 ENCOUNTER — Ambulatory Visit (INDEPENDENT_AMBULATORY_CARE_PROVIDER_SITE_OTHER): Payer: Medicare Other | Admitting: Urology

## 2020-03-21 VITALS — BP 112/72 | HR 98 | Temp 97.7°F | Ht 66.5 in | Wt 206.0 lb

## 2020-03-21 DIAGNOSIS — R972 Elevated prostate specific antigen [PSA]: Secondary | ICD-10-CM

## 2020-03-21 LAB — POCT URINALYSIS DIPSTICK
Bilirubin, UA: NEGATIVE
Blood, UA: NEGATIVE
Glucose, UA: NEGATIVE
Ketones, UA: NEGATIVE
Leukocytes, UA: NEGATIVE
Nitrite, UA: NEGATIVE
Protein, UA: NEGATIVE
Spec Grav, UA: 1.02 (ref 1.010–1.025)
Urobilinogen, UA: NEGATIVE E.U./dL — AB
pH, UA: 5 (ref 5.0–8.0)

## 2020-03-21 LAB — PSA: PSA: 16.5 ng/mL — ABNORMAL HIGH (ref <=4.0)

## 2020-03-21 NOTE — Consult Note (Signed)
H&P  Chief Complaint: Elevated PSA  History of Present Illness: 74 year old male sent from [facility] [facility]/Dr. Claretta Fraise for elevated PSA.  His PSA on 4.28.2021 was 12.4.  Apparently, he had his PSA checked in 2017 and it was approximately 1.4.  He denies significant lower urinary tract symptomatology.  He has no family history of prostate cancer.  He does have a history of urolithiasis.  IPSS 2 QoL score 1  Past Medical History:  Diagnosis Date  . Anxiety   . Chickenpox   . Depression   . High blood pressure   . High cholesterol   . Sleep apnea     Past Surgical History:  Procedure Laterality Date  . HERNIA REPAIR     abdominal  1998 2008, inguinal  . TONSILLECTOMY      Home Medications:  Allergies as of 03/21/2020   No Known Allergies     Medication List       Accurate as of March 21, 2020  9:57 AM. If you have any questions, ask your nurse or doctor.        atorvastatin 40 MG tablet Commonly known as: LIPITOR Take 1 tablet (40 mg total) by mouth daily. For cholesterol   b complex vitamins tablet Take 1 tablet by mouth daily.   citalopram 20 MG tablet Commonly known as: CELEXA Take 1 tablet (20 mg total) by mouth daily.   clonazePAM 0.5 MG tablet Commonly known as: KLONOPIN Take 1 tablet (0.5 mg total) by mouth 2 (two) times daily.   fenofibrate 48 MG tablet Commonly known as: TRICOR Take 1 tablet (48 mg total) by mouth daily.   fexofenadine-pseudoephedrine 180-240 MG 24 hr tablet Commonly known as: ALLEGRA-D 24 Take 1 tablet by mouth every evening. For allergy and congestion   fluticasone 50 MCG/ACT nasal spray Commonly known as: FLONASE Place 2 sprays into both nostrils 2 (two) times daily.   pantoprazole 40 MG tablet Commonly known as: PROTONIX Take 1 tablet (40 mg total) by mouth daily.   potassium citrate 10 MEQ (1080 MG) SR tablet Commonly known as: UROCIT-K Take 2 tablets (20 mEq total) by mouth 3 (three) times  daily with meals.   Prevagen 10 MG Caps Generic drug: Apoaequorin Take 1 tablet by mouth daily.   terazosin 2 MG capsule Commonly known as: HYTRIN Take 1 capsule (2 mg total) by mouth at bedtime.       Allergies: No Known Allergies  Family History  Problem Relation Age of Onset  . Emphysema Mother   . Liver cancer Brother   . Stroke Maternal Uncle   . Stroke Paternal Uncle   . Heart attack Maternal Grandfather   . Colon cancer Son   . Kidney cancer Son     Social History:  reports that he has never smoked. He has never used smokeless tobacco. He reports current alcohol use of about 6.0 - 7.0 standard drinks of alcohol per week. He reports that he does not use drugs.  ROS:    NO LUTS Otherwise ROS negative Physical Exam:  Vital signs in last 24 hours: BP 112/72   Pulse 98   Temp 97.7 F (36.5 C)   Ht 5' 6.5" (1.689 m)   Wt 206 lb (93.4 kg)   BMI 32.75 kg/m  Constitutional:  Alert and oriented, No acute distress Cardiovascular: Regular rate  Respiratory: Normal respiratory effort GI: Abdomen is obese, soft, nontender, nondistended, no abdominal masses. No CVAT.  No inguinal hernias. Genitourinary: Normal  male phallus, testes are descended bilaterally and non-tender and without masses, scrotum is normal in appearance without lesions or masses, perineum is normal on inspection.  Prostate 40 g, symmetrical. Lymphatic: No lymphadenopathy Neurologic: Grossly intact, no focal deficits Psychiatric: Normal mood and affect   Results for orders placed or performed in visit on 03/21/20 (from the past 24 hour(s))  POCT urinalysis dipstick     Status: Abnormal   Collection Time: 03/21/20  9:30 AM  Result Value Ref Range   Color, UA dk yellow    Clarity, UA clear    Glucose, UA Negative Negative   Bilirubin, UA neg    Ketones, UA neg    Spec Grav, UA 1.020 1.010 - 1.025   Blood, UA neg    pH, UA 5.0 5.0 - 8.0   Protein, UA Negative Negative   Urobilinogen, UA negative  (A) 0.2 or 1.0 E.U./dL   Nitrite, UA neg    Leukocytes, UA Negative Negative   Appearance     Odor     I have reviewed prior pt notes  I have reviewed notes from referring/previous physicians  I have reviewed urinalysis results  I have reviewed prior PSA results  Impression/Assessment:  Elevated PSA, with significant increase between 2017 and April, 2021.  He has a normal exam.  Plan:  1.  I will recheck his PSA today  2.  I did discuss eventual/possible transrectal ultrasound and biopsy with him, including risks and complications.  If PSA elevation continues, we will proceed with that

## 2020-03-21 NOTE — Progress Notes (Signed)
See progress note.

## 2020-03-24 ENCOUNTER — Other Ambulatory Visit: Payer: Self-pay | Admitting: Urology

## 2020-03-24 ENCOUNTER — Telehealth: Payer: Self-pay

## 2020-03-24 DIAGNOSIS — R972 Elevated prostate specific antigen [PSA]: Secondary | ICD-10-CM

## 2020-03-24 MED ORDER — LEVOFLOXACIN 750 MG PO TABS: 750.0000 mg | ORAL_TABLET | Freq: Every day | ORAL | 0 refills | Status: DC

## 2020-03-24 NOTE — Telephone Encounter (Signed)
Left message for patient to return call.

## 2020-03-24 NOTE — Telephone Encounter (Signed)
-----   Message from Franchot Gallo, MD sent at 03/24/2020  6:56 AM EDT ----- Notify pt--PSA now 16.5. WE NEED TO PROCEED W/ trus/bX AS DISCUSSED--ORDERS IN ----- Message ----- From: Iris Pert, LPN Sent: [id number]   8:06 AM EDT To: Franchot Gallo, MD  Please review

## 2020-03-28 ENCOUNTER — Telehealth: Payer: Self-pay | Admitting: Urology

## 2020-03-28 NOTE — Telephone Encounter (Signed)
Pt left vm to have a nurse call him with his PSA results.

## 2020-03-29 ENCOUNTER — Other Ambulatory Visit: Payer: Self-pay | Admitting: Urology

## 2020-03-29 ENCOUNTER — Other Ambulatory Visit: Payer: Self-pay

## 2020-03-29 DIAGNOSIS — R972 Elevated prostate specific antigen [PSA]: Secondary | ICD-10-CM

## 2020-03-29 MED ORDER — LEVOFLOXACIN 750 MG PO TABS: 750.0000 mg | ORAL_TABLET | Freq: Every day | ORAL | 0 refills | Status: DC

## 2020-03-29 NOTE — Telephone Encounter (Signed)
See other note

## 2020-03-29 NOTE — Progress Notes (Signed)
Mailed letter as well.

## 2020-03-29 NOTE — Telephone Encounter (Signed)
Biopsy instructions reviewed with pt and mailed a copy as well. Abx sent to [facility]. Pt voiced understanding.

## 2020-03-30 ENCOUNTER — Telehealth: Payer: Self-pay | Admitting: Urology

## 2020-03-30 NOTE — Telephone Encounter (Signed)
t requests a return call from nurse regarding him taking  Vitamins before procedure.

## 2020-03-31 NOTE — Telephone Encounter (Signed)
Left message to return call 

## 2020-04-03 MED ORDER — GENTAMICIN SULFATE 40 MG/ML IJ SOLN: 80.0000 mg | Freq: Once | INTRAMUSCULAR | Status: AC

## 2020-04-04 ENCOUNTER — Encounter (HOSPITAL_COMMUNITY): Payer: Self-pay

## 2020-04-04 ENCOUNTER — Other Ambulatory Visit: Payer: Self-pay | Admitting: Urology

## 2020-04-04 ENCOUNTER — Ambulatory Visit (HOSPITAL_COMMUNITY)
Admission: RE | Admit: 2020-04-04 | Discharge: 2020-04-04 | Disposition: A | Discharge status: Home / Self Care | Payer: Medicare Other | Source: Ambulatory Visit | Attending: Urology | Admitting: Urology

## 2020-04-04 ENCOUNTER — Other Ambulatory Visit: Payer: Self-pay

## 2020-04-04 ENCOUNTER — Ambulatory Visit (INDEPENDENT_AMBULATORY_CARE_PROVIDER_SITE_OTHER): Payer: Medicare Other | Admitting: Urology

## 2020-04-04 DIAGNOSIS — R972 Elevated prostate specific antigen [PSA]: Secondary | ICD-10-CM | POA: Insufficient documentation

## 2020-04-04 DIAGNOSIS — N429 Disorder of prostate, unspecified: Secondary | ICD-10-CM | POA: Diagnosis not present

## 2020-04-04 MED ORDER — GENTAMICIN SULFATE 40 MG/ML IJ SOLN
INTRAMUSCULAR | Status: AC
  Administered 2020-04-04: 80 mg via INTRAMUSCULAR
  Filled 2020-04-04: qty 4

## 2020-04-04 MED ORDER — LIDOCAINE HCL (PF) 2 % IJ SOLN
INTRAMUSCULAR | Status: AC
  Administered 2020-04-04: 10 mL
  Filled 2020-04-04: qty 10

## 2020-04-04 NOTE — Procedures (Signed)
Risks, benefits, and some of the potential complications of a transrectal ultrasounds of the prostate (TRUSP) with biopsies were discussed at length with the patient including gross hematuria, blood in the bowel movements, hematospermia, bacteremia, infection, voiding discomfort, urinary retention, fever, chills, sepsis, blood transfusion, death, and others. All questions were answered. Informed consent was obtained. The patient confirmed that he had taken his pre-procedure antibiotic. All anticoagulants were discontinued prior to the procedure. The patient emptied his bladder. He was positioned in a comfortable left lateral decubitus position with hips and knees acutely flexed.  The rectal probe was inserted into the rectum without difficulty. 10cc of 2% Lidocaine without epinephrine was instilled with a spinal needle using ultrasound guidance near the junction of each seminal vesicle and the prostate.  Sequential transverse (axial) scans were made in small increments beginning at the seminal vesicles and ending at the prostatic apex. Sequential longitudinal (saggital) scans were made in small increments beginning at the right lateral prostate and ending at the left lateral prostate. Excellent anatomical imaging was obtained. The peripheral, transitional, and central zones were well-defined. The seminal vesicles were normal.~~  Prostate volume 31.16ml.  There were no hypoechoic areas. 12 biopsies were performed. 1 biopsy each was taken from the following areas:  Right lateral base, right medial base, right lateral mid prostate, right medial mid prostate, right lateral apical prostate, right medial apical prostate, left lateral base, left medial base, left lateral mid prostate, left medial mid prostate, left lateral apical prostate, left medial apical prostate.. Minimal prostatic calcifications were noted. Excellent biopsy specimens were obtained.  Follow-up rectal examination was unremarkable. The  procedure was well-tolerated and without complications. Antibiotic instructions were given. The patient was told that:  For several days:  he should increase his fluid intake and limit strenuous activity  he might have mild discomfort at the base of his penis or in his rectum  he might have blood in his urine or blood in his bowel movements  For 2-3 months:  he might have blood in his ejaculate (semen)  Instructions were given to call the office immedicately for blood clots in the urine or bowel movements, difficulty urinating, inability to urinate, urinary retention, painful or frequent urination, fever, chills, nausea, vomiting, or other illness. The patient stated that he understood these instructions and would comply with them. We told the patient that prostate biopsy pathology reports are usually available within 3-5 working days, unless a pathologic second opinion is required, which may take 7-14 days. We told him to contact us to check on the status of his biopsy if he has not heard from [place] within 7 days. The patient left the ultrasound examination room in stable condition.

## 2020-04-05 ENCOUNTER — Telehealth: Payer: Self-pay | Admitting: Urology

## 2020-04-05 NOTE — Telephone Encounter (Signed)
Pt called because he got a message in mychart stating his biopsy results are back. He asked for a nurse to call him.

## 2020-04-06 ENCOUNTER — Other Ambulatory Visit: Payer: Self-pay | Admitting: Urology

## 2020-04-06 ENCOUNTER — Other Ambulatory Visit: Payer: Self-pay

## 2020-04-06 ENCOUNTER — Telehealth: Payer: Self-pay

## 2020-04-06 DIAGNOSIS — R972 Elevated prostate specific antigen [PSA]: Secondary | ICD-10-CM

## 2020-04-06 NOTE — Telephone Encounter (Signed)
See other task

## 2020-04-06 NOTE — Telephone Encounter (Signed)
-----   Message from Franchot Gallo, MD sent at 04/06/2020  7:33 AM EDT ----- please call patient--good news, all biopsies were benign.  I would like to see him back in 3 months.  I have put an order for PSA in. ----- Message ----- From: Dorisann Frames, RN Sent: 04/05/2020  12:20 PM EDT To: Franchot Gallo, MD  Biopsy report

## 2020-04-06 NOTE — Telephone Encounter (Signed)
Pt notified of results. appt scheduled and mailed with labs

## 2020-04-11 ENCOUNTER — Ambulatory Visit (INDEPENDENT_AMBULATORY_CARE_PROVIDER_SITE_OTHER): Payer: Medicare Other | Admitting: Ophthalmology

## 2020-04-11 ENCOUNTER — Encounter (INDEPENDENT_AMBULATORY_CARE_PROVIDER_SITE_OTHER): Payer: Self-pay | Admitting: Ophthalmology

## 2020-04-11 ENCOUNTER — Other Ambulatory Visit: Payer: Self-pay

## 2020-04-11 DIAGNOSIS — H353221 Exudative age-related macular degeneration, left eye, with active choroidal neovascularization: Secondary | ICD-10-CM

## 2020-04-11 MED ORDER — BEVACIZUMAB CHEMO INJECTION 1.25MG/0.05ML SYRINGE FOR KALEIDOSCOPE
1.2500 mg | INTRAVITREAL | Status: AC | PRN
  Administered 2020-04-11: 1.25 mg via INTRAVITREAL

## 2020-04-11 NOTE — Progress Notes (Signed)
04/11/2020     CHIEF COMPLAINT Patient presents for Retina Follow Up   HISTORY OF PRESENT ILLNESS: Marvin White is a 74 y.o. male who presents to the clinic today for:   HPI    Retina Follow Up    Patient presents with  Wet AMD.  In left eye.  This started 8 weeks ago.  Duration of 8 weeks.  Since onset it is stable.          Comments    8 week f/u dilated exam, OCT(MAC) and possible Avastin OS today. Pt denies noticeable changes to [place] OU since last visit. Pt denies ocular pain, flashes of light, or floaters OU.         Last edited by Melburn Popper, COA on 04/11/2020  1:22 PM. (History)      Referring physician: Claretta Fraise, MD [place],  [place] [address]  HISTORICAL INFORMATION:   Selected notes from the MEDICAL RECORD NUMBER       CURRENT MEDICATIONS: No current outpatient medications on file. (Ophthalmic Drugs)   No current facility-administered medications for this visit. (Ophthalmic Drugs)   Current Outpatient Medications (Other)  Medication Sig   Apoaequorin (PREVAGEN) 10 MG CAPS Take 1 tablet by mouth daily.   atorvastatin (LIPITOR) 40 MG tablet Take 1 tablet (40 mg total) by mouth daily. For cholesterol   b complex vitamins tablet Take 1 tablet by mouth daily.   citalopram (CELEXA) 20 MG tablet Take 1 tablet (20 mg total) by mouth daily.   clonazePAM (KLONOPIN) 0.5 MG tablet Take 1 tablet (0.5 mg total) by mouth 2 (two) times daily.   fenofibrate (TRICOR) 48 MG tablet Take 1 tablet (48 mg total) by mouth daily.   fexofenadine-pseudoephedrine (ALLEGRA-D 24) 180-240 MG 24 hr tablet Take 1 tablet by mouth every evening. For allergy and congestion   fluticasone (FLONASE) 50 MCG/ACT nasal spray Place 2 sprays into both nostrils 2 (two) times daily.   levofloxacin (LEVAQUIN) 750 MG tablet Take 1 tablet (750 mg total) by mouth daily. Take 1 pill day of procedure   pantoprazole (PROTONIX) 40 MG tablet Take 1 tablet (40 mg total) by  mouth daily.   potassium citrate (UROCIT-K) 10 MEQ (1080 MG) SR tablet Take 2 tablets (20 mEq total) by mouth 3 (three) times daily with meals.   terazosin (HYTRIN) 2 MG capsule Take 1 capsule (2 mg total) by mouth at bedtime.   No current facility-administered medications for this visit. (Other)      REVIEW OF SYSTEMS:    ALLERGIES No Known Allergies  PAST MEDICAL HISTORY Past Medical History:  Diagnosis Date   Anxiety    Chickenpox    Depression    High blood pressure    High cholesterol    Sleep apnea    Past Surgical History:  Procedure Laterality Date   HERNIA REPAIR     abdominal  1998 2008, inguinal   TONSILLECTOMY      FAMILY HISTORY Family History  Problem Relation Age of Onset   Emphysema Mother    Liver cancer Brother    Stroke Maternal Uncle    Stroke Paternal Uncle    Heart attack Maternal Grandfather    Colon cancer Son    Kidney cancer Son     SOCIAL HISTORY Social History   Tobacco Use   Smoking status: Never Smoker   Smokeless tobacco: Never Used  Vaping Use   Vaping Use: Never used  Substance Use  Topics   Alcohol use: Yes    Alcohol/week: 6.0 - 7.0 standard drinks    Types: 6 - 7 Cans of beer per week    Comment: 6-7 per day   Drug use: No         OPHTHALMIC EXAM:  Base Eye Exam    Visual Acuity (ETDRS)      Right Left   Dist cc 20/25 20/30   Correction: Glasses       Tonometry (Tonopen, 1:26 PM)      Right Left   Pressure 8 11       Pupils      Pupils Dark Light Shape React APD   Right PERRL 3 2 Round Brisk None   Left PERRL 3 2 Round Brisk None       Visual Fields (Counting fingers)      Left Right    Full Full       Extraocular Movement      Right Left    Full Full       Neuro/Psych    Oriented x3: Yes   Mood/Affect: Normal       Dilation    Left eye: 1.0% Mydriacyl, 2.5% Phenylephrine @ 1:26 PM        Slit Lamp and Fundus Exam    External Exam      Right Left    External Normal Normal       Slit Lamp Exam      Right Left   Lids/Lashes Normal Normal   Conjunctiva/Sclera White and quiet White and quiet   Cornea Clear Clear   Anterior Chamber Deep and quiet Deep and quiet   Iris Round and reactive Round and reactive   Lens 2+ Nuclear sclerosis 2+ Nuclear sclerosis   Anterior Vitreous Normal Normal       Fundus Exam      Right Left   Posterior Vitreous Normal Posterior vitreous detachment, Central vitreous floaters   Disc  Normal   C/D Ratio  0.3   Macula  Age related macular degeneration, Early age related macular degeneration,  Choroidal neovascular membrane, Retinal pigment epithelial mottling, Soft drusen, Pigmented atrophy   Vessels  Normal   Periphery  Normal          IMAGING AND PROCEDURES  Imaging and Procedures for 04/11/20  OCT, Retina - OU - Both Eyes       Right Eye Quality was good. Scan locations included subfoveal. Central Foveal Thickness: 380. Progression has been stable. Findings include no SRF, no IRF, retinal drusen , inner retinal atrophy, outer retinal atrophy, central retinal atrophy.   Left Eye Quality was good. Scan locations included subfoveal. Central Foveal Thickness: 312. Progression has been stable. Findings include intraretinal fluid, retinal drusen , outer retinal atrophy, central retinal atrophy, inner retinal atrophy.   Notes Vascularized pigment epithelial detachment superior to the fovea left eye, stable currently at 8-week interval examination and post Avastin       Intravitreal Injection, Pharmacologic Agent - OS - Left Eye       Time Out 04/11/2020. 2:48 PM. Confirmed correct patient, procedure, site, and patient consented.   Anesthesia Topical anesthesia was used. Anesthetic medications included Akten 3.5%.   Procedure Preparation included Tobramycin 0.3%, 10% betadine to eyelids, 5% betadine to ocular surface. A 30 gauge needle was used.   Injection:  1.25 mg Bevacizumab (AVASTIN)  SOLN   NDC: [address]-2, Lot: [id number]   Route: Intravitreal, Site: Left Eye, Waste: 0  mg  Post-op Post injection exam found visual acuity of at least counting fingers. The patient tolerated the procedure well. There were no complications. The patient received written and verbal post procedure care education. Post injection medications were not given.                 ASSESSMENT/PLAN:  Exudative age-related macular degeneration of left eye with active choroidal neovascularization (HCC) OS, stable today on 8-week examination post intravitreal Avastin, vascularized pigment epithelial detachment superior to the vasa stable.  We will repeat injection OS today and examination      ICD-10-CM   1. Exudative age-related macular degeneration of left eye with active choroidal neovascularization (HCC)  H35.3221 OCT, Retina - OU - Both Eyes    Intravitreal Injection, Pharmacologic Agent - OS - Left Eye    Bevacizumab (AVASTIN) SOLN 1.25 mg    1.  OS, stable today on 8-week examination post intravitreal Avastin, vascularized pigment epithelial detachment superior to the vasa stable.  We will repeat injection OS today and examination  2.  Demonstrate normal  3.  Ophthalmic Meds Ordered this visit:  Meds ordered this encounter  Medications   Bevacizumab (AVASTIN) SOLN 1.25 mg       Return in about 10 weeks (around 06/20/2020) for dilate, OS, AVASTIN OCT.  There are no Patient Instructions on file for this visit.   Explained the diagnoses, plan, and follow up with the patient and they expressed understanding.  Patient expressed understanding of the importance of proper follow up care.   Clent Demark Axil Copeman M.D. Diseases & Surgery of the Retina and Vitreous [facility] 04/11/20     Abbreviations: M myopia (nearsighted); A astigmatism; H hyperopia (farsighted); P presbyopia; Mrx spectacle prescription;  CTL contact lenses; OD right eye; OS left eye; OU both eyes  XT  exotropia; ET esotropia; PEK punctate epithelial keratitis; PEE punctate epithelial erosions; DES dry eye syndrome; MGD meibomian gland dysfunction; ATs artificial tears; PFAT's preservative free artificial tears; [place] nuclear sclerotic cataract; PSC posterior subcapsular cataract; ERM epi-retinal membrane; PVD posterior vitreous detachment; RD retinal detachment; DM diabetes mellitus; DR diabetic retinopathy; NPDR non-proliferative diabetic retinopathy; PDR proliferative diabetic retinopathy; CSME clinically significant macular edema; DME diabetic macular edema; dbh dot blot hemorrhages; CWS cotton wool spot; POAG primary open angle glaucoma; C/D cup-to-disc ratio; HVF humphrey visual field; GVF goldmann visual field; OCT optical coherence tomography; IOP intraocular pressure; BRVO Branch retinal vein occlusion; CRVO central retinal vein occlusion; CRAO central retinal artery occlusion; BRAO branch retinal artery occlusion; RT retinal tear; SB scleral buckle; PPV pars plana vitrectomy; VH Vitreous hemorrhage; PRP panretinal laser photocoagulation; IVK intravitreal kenalog; VMT vitreomacular traction; MH Macular hole;  NVD neovascularization of the disc; NVE neovascularization elsewhere; AREDS age related eye disease study; ARMD age related macular degeneration; POAG primary open angle glaucoma; EBMD epithelial/anterior basement membrane dystrophy; ACIOL anterior chamber intraocular lens; IOL intraocular lens; PCIOL posterior chamber intraocular lens; Phaco/IOL phacoemulsification with intraocular lens placement; [place] photorefractive keratectomy; LASIK laser assisted in situ keratomileusis; HTN hypertension; DM diabetes mellitus; COPD chronic obstructive pulmonary disease

## 2020-04-11 NOTE — Assessment & Plan Note (Signed)
OS, stable today on 8-week examination post intravitreal Avastin, vascularized pigment epithelial detachment superior to the vasa stable.  We will repeat injection OS today and examination

## 2020-05-04 DIAGNOSIS — H2513 Age-related nuclear cataract, bilateral: Secondary | ICD-10-CM | POA: Diagnosis not present

## 2020-05-04 DIAGNOSIS — H35371 Puckering of macula, right eye: Secondary | ICD-10-CM | POA: Diagnosis not present

## 2020-05-04 DIAGNOSIS — H3530 Unspecified macular degeneration: Secondary | ICD-10-CM | POA: Diagnosis not present

## 2020-05-04 DIAGNOSIS — H43813 Vitreous degeneration, bilateral: Secondary | ICD-10-CM | POA: Diagnosis not present

## 2020-05-08 ENCOUNTER — Other Ambulatory Visit: Payer: Self-pay | Admitting: Family Medicine

## 2020-05-10 ENCOUNTER — Other Ambulatory Visit: Payer: Self-pay

## 2020-05-10 ENCOUNTER — Ambulatory Visit (INDEPENDENT_AMBULATORY_CARE_PROVIDER_SITE_OTHER): Payer: Medicare Other | Admitting: Ophthalmology

## 2020-05-10 ENCOUNTER — Encounter (INDEPENDENT_AMBULATORY_CARE_PROVIDER_SITE_OTHER): Payer: Self-pay | Admitting: Ophthalmology

## 2020-05-10 DIAGNOSIS — H35371 Puckering of macula, right eye: Secondary | ICD-10-CM

## 2020-05-10 DIAGNOSIS — H2511 Age-related nuclear cataract, right eye: Secondary | ICD-10-CM

## 2020-05-10 DIAGNOSIS — H353221 Exudative age-related macular degeneration, left eye, with active choroidal neovascularization: Secondary | ICD-10-CM

## 2020-05-10 DIAGNOSIS — H353211 Exudative age-related macular degeneration, right eye, with active choroidal neovascularization: Secondary | ICD-10-CM

## 2020-05-10 HISTORY — DX: Age-related nuclear cataract, right eye: H25.11

## 2020-05-10 NOTE — Assessment & Plan Note (Signed)
Follow-up left eye examination as scheduled

## 2020-05-10 NOTE — Assessment & Plan Note (Signed)
The nature of cataract was discussed with the patient as well as the elective nature of surgery. The patient was reassured that surgery at a later date does not put the patient at risk for a worse outcome. It was emphasized that the need for surgery is dictated by the patient's quality of life as influenced by the cataract. Patient was instructed to maintain close follow up with their general eye care doctor. Patient had recent opinion with Dr. Wyatt Portela, and will follow up as scheduled for cataract evaluation again in 1 year

## 2020-05-10 NOTE — Progress Notes (Signed)
05/10/2020     CHIEF COMPLAINT Patient presents for Retina Follow Up   HISTORY OF PRESENT ILLNESS: Marvin White is a 74 y.o. male who presents to the clinic today for:   HPI    Retina Follow Up    Patient presents with  Wet AMD.  In right eye.  Severity is moderate.  Duration of 3 months.  Since onset it is stable.  I, the attending physician,  performed the HPI with the patient and updated documentation appropriately.          Comments    3 Month Wet AMD f\u OD. Possible Avastin OD. OCT  Pt states vision is stable. Pt c/o OD being watery off and on.       Last edited by Tilda Franco on 05/10/2020  1:16 PM. (History)      Referring physician: Claretta Fraise, MD [place],  [place] [address]  HISTORICAL INFORMATION:   Selected notes from the MEDICAL RECORD NUMBER       CURRENT MEDICATIONS: No current outpatient medications on file. (Ophthalmic Drugs)   No current facility-administered medications for this visit. (Ophthalmic Drugs)   Current Outpatient Medications (Other)  Medication Sig  . Apoaequorin (PREVAGEN) 10 MG CAPS Take 1 tablet by mouth daily.  Marland Kitchen atorvastatin (LIPITOR) 40 MG tablet Take 1 tablet (40 mg total) by mouth daily. For cholesterol  . b complex vitamins tablet Take 1 tablet by mouth daily.  . citalopram (CELEXA) 20 MG tablet Take 1 tablet (20 mg total) by mouth daily.  . clonazePAM (KLONOPIN) 0.5 MG tablet Take 1 tablet (0.5 mg total) by mouth 2 (two) times daily.  . fenofibrate (TRICOR) 48 MG tablet Take 1 tablet (48 mg total) by mouth daily.  . fexofenadine-pseudoephedrine (ALLEGRA-D 24) 180-240 MG 24 hr tablet Take 1 tablet by mouth every evening. For allergy and congestion  . fluticasone (FLONASE) 50 MCG/ACT nasal spray Place 2 sprays into both nostrils 2 (two) times daily.  Marland Kitchen levofloxacin (LEVAQUIN) 750 MG tablet Take 1 tablet (750 mg total) by mouth daily. Take 1 pill day of procedure  . pantoprazole (PROTONIX) 40 MG tablet  Take 1 tablet (40 mg total) by mouth daily.  . potassium citrate (UROCIT-K) 10 MEQ (1080 MG) SR tablet Take 2 tablets (20 mEq total) by mouth 3 (three) times daily with meals.  . terazosin (HYTRIN) 2 MG capsule TAKE ONE CAPSULE BY MOUTH AT BEDTIME   No current facility-administered medications for this visit. (Other)      REVIEW OF SYSTEMS:    ALLERGIES No Known Allergies  PAST MEDICAL HISTORY Past Medical History:  Diagnosis Date  . Anxiety   . Chickenpox   . Depression   . High blood pressure   . High cholesterol   . Sleep apnea    Past Surgical History:  Procedure Laterality Date  . HERNIA REPAIR     abdominal  1998 2008, inguinal  . TONSILLECTOMY      FAMILY HISTORY Family History  Problem Relation Age of Onset  . Emphysema Mother   . Liver cancer Brother   . Stroke Maternal Uncle   . Stroke Paternal Uncle   . Heart attack Maternal Grandfather   . Colon cancer Son   . Kidney cancer Son     SOCIAL HISTORY Social History   Tobacco Use  . Smoking status: Never Smoker  . Smokeless tobacco: Never Used  Vaping Use  . Vaping Use: Never used  Substance  Use Topics  . Alcohol use: Yes    Alcohol/week: 6.0 - 7.0 standard drinks    Types: 6 - 7 Cans of beer per week    Comment: 6-7 per day  . Drug use: No         OPHTHALMIC EXAM:  Base Eye Exam    Visual Acuity (Snellen - Linear)      Right Left   Dist cc 20/20 -2 20/40 +   Dist ph cc  20/30 -1   Correction: Glasses       Tonometry (Tonopen, 1:21 PM)      Right Left   Pressure 8 10       Pupils      Pupils Dark Light Shape React APD   Right PERRL 3 2 Round Brisk None   Left PERRL 3 2 Round Brisk None       Visual Fields (Counting fingers)      Left Right    Full Full       Neuro/Psych    Oriented x3: Yes   Mood/Affect: Normal       Dilation    Right eye: 1.0% Mydriacyl, 2.5% Phenylephrine @ 1:21 PM        Slit Lamp and Fundus Exam    External Exam      Right Left    External Normal Normal       Slit Lamp Exam      Right Left   Lids/Lashes Normal Normal   Conjunctiva/Sclera White and quiet White and quiet   Cornea Clear Clear   Anterior Chamber Deep and quiet Deep and quiet   Iris Round and reactive Round and reactive   Lens 2+ Nuclear sclerosis 2+ Nuclear sclerosis   Anterior Vitreous Normal Normal       Fundus Exam      Right Left   Posterior Vitreous Posterior vitreous detachment    Disc Normal    C/D Ratio 0.3    Macula Epiretinal membrane, Retinal pigment epithelial atrophy, Retinal atrophy, Hard drusen, no exudates, no hemorrhage, no macular thickening    Vessels Normal    Periphery Normal           IMAGING AND PROCEDURES  Imaging and Procedures for 05/10/20  OCT, Retina - OU - Both Eyes       Right Eye Quality was good. Scan locations included subfoveal. Central Foveal Thickness: 366. Findings include pigment epithelial detachment, no SRF, retinal drusen , epiretinal membrane.   Left Eye Quality was good. Scan locations included subfoveal. Central Foveal Thickness: 276. Findings include abnormal foveal contour, no SRF, intraretinal fluid, pigment epithelial detachment, retinal drusen .   Notes OS with chronic intraretinal fluid with vascularized PED superotemporal to the fovea.  Follow-up as scheduled  OD Today in follow-up of interval 3 months, no active CNVM will observe                ASSESSMENT/PLAN:  Nuclear sclerotic cataract of right eye The nature of cataract was discussed with the patient as well as the elective nature of surgery. The patient was reassured that surgery at a later date does not put the patient at risk for a worse outcome. It was emphasized that the need for surgery is dictated by the patient's quality of life as influenced by the cataract. Patient was instructed to maintain close follow up with their general eye care doctor. Patient had recent opinion with Dr. Wyatt Portela, and will follow up  as scheduled  for cataract evaluation again in 1 year  Exudative age-related macular degeneration of right eye with active choroidal neovascularization ([place]) At 66-month follow-up today there are no signs of active CNVM.  Retinal thickening is on the basis of mild to moderate epiretinal membrane, drusenoid subfoveal pigment epithelial deposit is without signs of vascularity  Macular pucker, right eye The nature of macular pucker (epiretinal membrane ERM) was discussed with the patient as well as threshold criteria for vitrectomy surgery. I explained that in rare cases another surgery is needed to actually remove a second wrinkle should it regrow.  Most often, the epiretinal membrane and underlying wrinkled internal limiting membrane are removed with the first surgery, to accomplish the goals.   If the operative eye is Phakic (natural lens still present), cataract surgery is often recommended prior to Vitrectomy. This will enable the retina surgeon to have the best view during surgery and the patient to obtain optimal results in the future. Treatment options were discussed.  minor, no effect on vision   Exudative age-related macular degeneration of left eye with active choroidal neovascularization ([place]) Follow-up left eye examination as scheduled      ICD-10-CM   1. Exudative age-related macular degeneration of right eye with active choroidal neovascularization (HCC)  H35.3211 OCT, Retina - OU - Both Eyes  2. Nuclear sclerotic cataract of right eye  H25.11   3. Macular pucker, right eye  H35.371   4. Exudative age-related macular degeneration of left eye with active choroidal neovascularization ([place])  H35.3221     1.  No active CNVM OD today, will monitor and follow the right eye.  At 59-month interval.  Next visit with left eye next September 28, will recheck status of the macula right eye at that date  2.  Dilate left eye next as scheduled  3.  Ophthalmic Meds Ordered this visit:  No orders  of the defined types were placed in this encounter.      Return for As scheduled, dilate, OS, AVASTIN OCT.  There are no Patient Instructions on file for this visit.   Explained the diagnoses, plan, and follow up with the patient and they expressed understanding.  Patient expressed understanding of the importance of proper follow up care.   Clent Demark Keshawn Sundberg M.D. Diseases & Surgery of the Retina and Vitreous [facility] [place] 05/10/20     Abbreviations: M myopia (nearsighted); A astigmatism; H hyperopia (farsighted); P presbyopia; Mrx spectacle prescription;  CTL contact lenses; OD right eye; OS left eye; OU both eyes  XT exotropia; ET esotropia; PEK punctate epithelial keratitis; PEE punctate epithelial erosions; DES dry eye syndrome; MGD meibomian gland dysfunction; ATs artificial tears; PFAT's preservative free artificial tears; [place] nuclear sclerotic cataract; PSC posterior subcapsular cataract; ERM epi-retinal membrane; PVD posterior vitreous detachment; RD retinal detachment; DM diabetes mellitus; DR diabetic retinopathy; NPDR non-proliferative diabetic retinopathy; PDR proliferative diabetic retinopathy; CSME clinically significant macular edema; DME diabetic macular edema; dbh dot blot hemorrhages; CWS cotton wool spot; POAG primary open angle glaucoma; C/D cup-to-disc ratio; HVF humphrey visual field; GVF goldmann visual field; OCT optical coherence tomography; IOP intraocular pressure; BRVO Branch retinal vein occlusion; CRVO central retinal vein occlusion; CRAO central retinal artery occlusion; BRAO branch retinal artery occlusion; RT retinal tear; SB scleral buckle; PPV pars plana vitrectomy; VH Vitreous hemorrhage; PRP panretinal laser photocoagulation; IVK intravitreal kenalog; VMT vitreomacular traction; MH Macular hole;  NVD neovascularization of the disc; NVE neovascularization elsewhere; AREDS age related eye disease study; ARMD age related  macular degeneration; POAG  primary open angle glaucoma; EBMD epithelial/anterior basement membrane dystrophy; ACIOL anterior chamber intraocular lens; IOL intraocular lens; PCIOL posterior chamber intraocular lens; Phaco/IOL phacoemulsification with intraocular lens placement; [place] photorefractive keratectomy; LASIK laser assisted in situ keratomileusis; HTN hypertension; DM diabetes mellitus; COPD chronic obstructive pulmonary disease

## 2020-05-10 NOTE — Assessment & Plan Note (Addendum)
The nature of macular pucker (epiretinal membrane ERM) was discussed with the patient as well as threshold criteria for vitrectomy surgery. I explained that in rare cases another surgery is needed to actually remove a second wrinkle should it regrow.  Most often, the epiretinal membrane and underlying wrinkled internal limiting membrane are removed with the first surgery, to accomplish the goals.   If the operative eye is Phakic (natural lens still present), cataract surgery is often recommended prior to Vitrectomy. This will enable the retina surgeon to have the best view during surgery and the patient to obtain optimal results in the future. Treatment options were discussed.  minor, no effect on vision

## 2020-05-10 NOTE — Assessment & Plan Note (Signed)
At 43-month follow-up today there are no signs of active CNVM.  Retinal thickening is on the basis of mild to moderate epiretinal membrane, drusenoid subfoveal pigment epithelial deposit is without signs of vascularity

## 2020-06-07 ENCOUNTER — Telehealth: Payer: Self-pay | Admitting: Family Medicine

## 2020-06-08 ENCOUNTER — Other Ambulatory Visit: Payer: Self-pay | Admitting: Family Medicine

## 2020-06-08 MED ORDER — PREVAGEN 10 MG PO CAPS: 1.0000 | ORAL_CAPSULE | Freq: Every day | ORAL | Status: AC

## 2020-06-08 NOTE — Telephone Encounter (Signed)
Appointment scheduled.

## 2020-06-08 NOTE — Telephone Encounter (Signed)
This would be an appropriate visit for my acute day tomorrow. I find no record of calling in anything for him for vertigo and have no recollection of it.  Based on chart review I would like to see him for this. Thanks, WS

## 2020-06-09 ENCOUNTER — Other Ambulatory Visit: Payer: Self-pay

## 2020-06-09 ENCOUNTER — Encounter: Payer: Self-pay | Admitting: Family Medicine

## 2020-06-09 ENCOUNTER — Ambulatory Visit (INDEPENDENT_AMBULATORY_CARE_PROVIDER_SITE_OTHER): Payer: Medicare Other | Admitting: Family Medicine

## 2020-06-09 VITALS — BP 125/81 | HR 96 | Temp 98.1°F | Resp 20 | Ht 66.5 in | Wt 203.0 lb

## 2020-06-09 DIAGNOSIS — F1020 Alcohol dependence, uncomplicated: Secondary | ICD-10-CM | POA: Diagnosis not present

## 2020-06-09 DIAGNOSIS — F411 Generalized anxiety disorder: Secondary | ICD-10-CM

## 2020-06-09 DIAGNOSIS — J40 Bronchitis, not specified as acute or chronic: Secondary | ICD-10-CM

## 2020-06-09 DIAGNOSIS — J329 Chronic sinusitis, unspecified: Secondary | ICD-10-CM

## 2020-06-09 DIAGNOSIS — H6591 Unspecified nonsuppurative otitis media, right ear: Secondary | ICD-10-CM

## 2020-06-09 DIAGNOSIS — R42 Dizziness and giddiness: Secondary | ICD-10-CM | POA: Diagnosis not present

## 2020-06-09 MED ORDER — AMOXICILLIN-POT CLAVULANATE 875-125 MG PO TABS
1.0000 | ORAL_TABLET | Freq: Two times a day (BID) | ORAL | 0 refills | Status: AC
Start: 2020-06-09 — End: ?

## 2020-06-09 MED ORDER — CLONAZEPAM 0.5 MG PO TABS: 0.5000 mg | ORAL_TABLET | Freq: Every day | ORAL | 5 refills | Status: AC

## 2020-06-09 NOTE — Progress Notes (Signed)
Subjective:  Patient ID: Marvin White, male    DOB: [birth year]  Age: 74 y.o. MRN: [id number]  CC: Dizziness   HPI Marvin White presents for insidious onset of dizziness when he first awakens and gets out of bed.  It lasts about 5 to 6 seconds.  It is gone then until such time as he might stand up or bends over and get back up.  He reports that he has a right earache.  He has had no fever chills or sweats.  No sore throat or other respiratory symptoms.  He has not fallen.  He has had no loss of consciousness.  He has no headaches. Patient says he is drinking about 164 ounces daily of beer.  He denies any feelings of inebriation during this time.  He relates it to losing his son to colon cancer 2 years ago at age 45.  He had stopped drinking until then.  Now he understands that what he is doing is harmful to himself but he has no desire to stop.  Depression screen [facility] 2/9 06/09/2020 01/19/2020 11/10/2019  Decreased Interest 0 0 0  Down, Depressed, Hopeless 0 0 0  PHQ - 2 Score 0 0 0  Altered sleeping - - -  Tired, decreased energy - - -  Change in appetite - - -  Feeling bad or failure about yourself  - - -  Trouble concentrating - - -  Moving slowly or fidgety/restless - - -  Suicidal thoughts - - -  PHQ-9 Score - - -    History Marvin White has a past medical history of Anxiety, Chickenpox, Depression, High blood pressure, High cholesterol, and Sleep apnea.   He has a past surgical history that includes Hernia repair and Tonsillectomy.   His family history includes Colon cancer in his son; Emphysema in his mother; Heart attack in his maternal grandfather; Kidney cancer in his son; Liver cancer in his brother; Stroke in his maternal uncle and paternal uncle.He reports that he has never smoked. He has never used smokeless tobacco. He reports current alcohol use of about 6.0 - 7.0 standard drinks of alcohol per week. He reports that he does not use drugs.    ROS Review of Systems    Constitutional: Negative for activity change, appetite change and fever.  HENT: Positive for ear pain.   Respiratory: Negative for shortness of breath.   Cardiovascular: Negative for chest pain.  Musculoskeletal: Negative for arthralgias.  Skin: Negative for rash.    Objective:  BP 125/81   Pulse 96   Temp 98.1 F (36.7 C) (Temporal)   Resp 20   Ht 5' 6.5" (1.689 m)   Wt 203 lb (92.1 kg)   SpO2 98%   BMI 32.27 kg/m   BP Readings from Last 3 Encounters:  06/09/20 125/81  04/04/20 113/79  03/21/20 112/72    Wt Readings from Last 3 Encounters:  06/09/20 203 lb (92.1 kg)  03/21/20 206 lb (93.4 kg)  02/24/20 206 lb (93.4 kg)     Physical Exam Vitals reviewed.  Constitutional:      Appearance: He is well-developed.  HENT:     Head: Normocephalic and atraumatic.     Right Ear: External ear normal. No decreased hearing noted.     Left Ear: Tympanic membrane and external ear normal. No decreased hearing noted.     Ears:     Comments:   There is fluid noted behind the right tympanic    Mouth/Throat:  Pharynx: No oropharyngeal exudate or posterior oropharyngeal erythema.  Eyes:     Pupils: Pupils are equal, round, and reactive to light.  Cardiovascular:     Rate and Rhythm: Normal rate and regular rhythm.     Heart sounds: No murmur heard.   Pulmonary:     Effort: No respiratory distress.     Breath sounds: Normal breath sounds.  Abdominal:     Palpations: Abdomen is soft.  Musculoskeletal:     Cervical back: Normal range of motion and neck supple.       Assessment & Plan:   Marvin White was seen today for dizziness.  Diagnoses and all orders for this visit:  Sinobronchitis  Right otitis media with effusion  Dizziness  Alcoholism ([place])  Other orders -     amoxicillin-clavulanate (AUGMENTIN) 875-125 MG tablet; Take 1 tablet by mouth 2 (two) times daily. Take all of this medication    Marvin White was strongly encouraged to discontinue all alcohol.   He declined to do so.  I recommended that he seek rehab and detox.  He declined those options.  He said that when he gets ready to quit he can do it on his own.  Of note is that he is still taking his clonazepam.   I am having Vangie Bicker. Bottcher start on amoxicillin-clavulanate. I am also having him maintain his b complex vitamins, fluticasone, citalopram, clonazePAM, fenofibrate, fexofenadine-pseudoephedrine, pantoprazole, potassium citrate, atorvastatin, terazosin, and Prevagen.  Allergies as of 06/09/2020   No Known Allergies     Medication List       Accurate as of June 09, 2020  8:37 PM. If you have any questions, ask your nurse or doctor.        amoxicillin-clavulanate 875-125 MG tablet Commonly known as: AUGMENTIN Take 1 tablet by mouth 2 (two) times daily. Take all of this medication Started by: Claretta Fraise, MD   atorvastatin 40 MG tablet Commonly known as: LIPITOR Take 1 tablet (40 mg total) by mouth daily. For cholesterol   b complex vitamins tablet Take 1 tablet by mouth daily.   citalopram 20 MG tablet Commonly known as: CELEXA Take 1 tablet (20 mg total) by mouth daily.   clonazePAM 0.5 MG tablet Commonly known as: KLONOPIN Take 1 tablet (0.5 mg total) by mouth 2 (two) times daily.   fenofibrate 48 MG tablet Commonly known as: TRICOR Take 1 tablet (48 mg total) by mouth daily.   fexofenadine-pseudoephedrine 180-240 MG 24 hr tablet Commonly known as: ALLEGRA-D 24 Take 1 tablet by mouth every evening. For allergy and congestion   fluticasone 50 MCG/ACT nasal spray Commonly known as: FLONASE Place 2 sprays into both nostrils 2 (two) times daily.   pantoprazole 40 MG tablet Commonly known as: PROTONIX Take 1 tablet (40 mg total) by mouth daily.   potassium citrate 10 MEQ (1080 MG) SR tablet Commonly known as: UROCIT-K Take 2 tablets (20 mEq total) by mouth 3 (three) times daily with meals.   Prevagen 10 MG Caps Generic drug: Apoaequorin Take 1  tablet by mouth daily.   terazosin 2 MG capsule Commonly known as: HYTRIN TAKE ONE CAPSULE BY MOUTH AT BEDTIME        Follow-up: No follow-ups on file.  Claretta Fraise, M.D.

## 2020-06-12 ENCOUNTER — Telehealth: Payer: Self-pay | Admitting: Family Medicine

## 2020-06-12 ENCOUNTER — Other Ambulatory Visit: Payer: Self-pay | Admitting: Family Medicine

## 2020-06-12 MED ORDER — NEOMYCIN-POLYMYXIN-HC 1 % OT SOLN: 3.0000 [drp] | Freq: Four times a day (QID) | OTIC | 0 refills | Status: AC

## 2020-06-12 MED ORDER — CIPROFLOXACIN HCL 500 MG PO TABS
500.0000 mg | ORAL_TABLET | Freq: Two times a day (BID) | ORAL | 0 refills | Status: AC
Start: 2020-06-12 — End: ?

## 2020-06-12 NOTE — Telephone Encounter (Signed)
No need to take the cipro. My apology. I assumed if the drop didn't go through then neither the drop or the pill did.

## 2020-06-12 NOTE — Telephone Encounter (Signed)
Patient was given Augmentin 9/17.  Patient called in today requesting ear drops.  Ear drops were sent and also Cipro.  Please advise if patient should be Augmentin or cipro?

## 2020-06-12 NOTE — Telephone Encounter (Signed)
Please let the patient know that I sent their prescription to their pharmacy. Thanks, WS 

## 2020-06-12 NOTE — Telephone Encounter (Signed)
Patient aware.

## 2020-06-12 NOTE — Telephone Encounter (Signed)
Pt was here last Friday to see [facility] and says that the rx for ear drops not at [facility] in [place]. Please call pt when done.

## 2020-06-12 NOTE — Telephone Encounter (Signed)
Patient aware and verbalizes understanding. 

## 2020-06-20 ENCOUNTER — Encounter (INDEPENDENT_AMBULATORY_CARE_PROVIDER_SITE_OTHER): Payer: Self-pay | Admitting: Ophthalmology

## 2020-06-20 ENCOUNTER — Encounter (INDEPENDENT_AMBULATORY_CARE_PROVIDER_SITE_OTHER): Payer: Medicare Other | Admitting: Ophthalmology

## 2020-06-20 ENCOUNTER — Ambulatory Visit (INDEPENDENT_AMBULATORY_CARE_PROVIDER_SITE_OTHER): Payer: Medicare Other | Admitting: Ophthalmology

## 2020-06-20 ENCOUNTER — Other Ambulatory Visit: Payer: Self-pay

## 2020-06-20 DIAGNOSIS — H353211 Exudative age-related macular degeneration, right eye, with active choroidal neovascularization: Secondary | ICD-10-CM

## 2020-06-20 DIAGNOSIS — H2512 Age-related nuclear cataract, left eye: Secondary | ICD-10-CM

## 2020-06-20 DIAGNOSIS — H353221 Exudative age-related macular degeneration, left eye, with active choroidal neovascularization: Secondary | ICD-10-CM | POA: Diagnosis not present

## 2020-06-20 MED ORDER — BEVACIZUMAB CHEMO INJECTION 1.25MG/0.05ML SYRINGE FOR KALEIDOSCOPE
1.2500 mg | INTRAVITREAL | Status: AC | PRN
  Administered 2020-06-20: 1.25 mg via INTRAVITREAL

## 2020-06-20 NOTE — Assessment & Plan Note (Signed)
Cataract(s) account for the patient's complaint. I discussed the risks and benefits of cataract surgery. Options were explained to the patient. The patient understands that new glasses may not improve their vision and desires to have cataract surgery. I have recommended follow up with their general eye care doctor for evaluation and consideration of cataract extraction with new intraocular lens insertion.  OS:, I do recommend cataract traction with intraocular lens placement to maximize patient potential vision as he wishes to see the [employer] more clearly.

## 2020-06-20 NOTE — Assessment & Plan Note (Signed)
ARMD, improved and now stable at 10-week interval post Avastin.  We will repeat injection OS today to keep stable and to follow-up OS in 12 weeks

## 2020-06-20 NOTE — Progress Notes (Signed)
06/20/2020     CHIEF COMPLAINT Patient presents for Retina Follow Up   HISTORY OF PRESENT ILLNESS: Marvin White is a 74 y.o. male who presents to the clinic today for:   HPI    Retina Follow Up    Patient presents with  Wet AMD.  In left eye.  Severity is moderate.  Duration of 10 weeks.  Since onset it is stable.  I, the attending physician,  performed the HPI with the patient and updated documentation appropriately.          Comments    10 Week Wet AMD f\u OS. Possible Avastin OS. OCT  Pt c/o blurry vision while watching TV. Pt states he has trouble reading the race lineup even with gls on. Pt states he covered OD the other night and OS "wandered".       Last edited by Tilda Franco on 06/20/2020  2:29 PM. (History)      Referring physician: Claretta Fraise, MD [place],  [place] [address]  HISTORICAL INFORMATION:   Selected notes from the MEDICAL RECORD NUMBER       CURRENT MEDICATIONS: No current outpatient medications on file. (Ophthalmic Drugs)   No current facility-administered medications for this visit. (Ophthalmic Drugs)   Current Outpatient Medications (Other)  Medication Sig  . amoxicillin-clavulanate (AUGMENTIN) 875-125 MG tablet Take 1 tablet by mouth 2 (two) times daily. Take all of this medication  . Apoaequorin (PREVAGEN) 10 MG CAPS Take 1 tablet by mouth daily. (Patient not taking: Reported on 06/09/2020)  . atorvastatin (LIPITOR) 40 MG tablet Take 1 tablet (40 mg total) by mouth daily. For cholesterol  . b complex vitamins tablet Take 1 tablet by mouth daily.  . ciprofloxacin (CIPRO) 500 MG tablet Take 1 tablet (500 mg total) by mouth 2 (two) times daily.  . citalopram (CELEXA) 20 MG tablet Take 1 tablet (20 mg total) by mouth daily.  . clonazePAM (KLONOPIN) 0.5 MG tablet Take 1 tablet (0.5 mg total) by mouth at bedtime.  . fenofibrate (TRICOR) 48 MG tablet Take 1 tablet (48 mg total) by mouth daily.  . fexofenadine-pseudoephedrine  (ALLEGRA-D 24) 180-240 MG 24 hr tablet Take 1 tablet by mouth every evening. For allergy and congestion  . fluticasone (FLONASE) 50 MCG/ACT nasal spray Place 2 sprays into both nostrils 2 (two) times daily.  . NEOMYCIN-POLYMYXIN-HYDROCORTISONE (CORTISPORIN) 1 % SOLN OTIC solution Place 3 drops into both ears 4 (four) times daily.  . pantoprazole (PROTONIX) 40 MG tablet Take 1 tablet (40 mg total) by mouth daily.  . potassium citrate (UROCIT-K) 10 MEQ (1080 MG) SR tablet Take 2 tablets (20 mEq total) by mouth 3 (three) times daily with meals.  . terazosin (HYTRIN) 2 MG capsule TAKE ONE CAPSULE BY MOUTH AT BEDTIME   No current facility-administered medications for this visit. (Other)      REVIEW OF SYSTEMS:    ALLERGIES No Known Allergies  PAST MEDICAL HISTORY Past Medical History:  Diagnosis Date  . Anxiety   . Chickenpox   . Depression   . High blood pressure   . High cholesterol   . Sleep apnea    Past Surgical History:  Procedure Laterality Date  . HERNIA REPAIR     abdominal  1998 2008, inguinal  . TONSILLECTOMY      FAMILY HISTORY Family History  Problem Relation Age of Onset  . Emphysema Mother   . Liver cancer Brother   . Stroke Maternal Uncle   .  Stroke Paternal Uncle   . Heart attack Maternal Grandfather   . Colon cancer Son   . Kidney cancer Son     SOCIAL HISTORY Social History   Tobacco Use  . Smoking status: Never Smoker  . Smokeless tobacco: Never Used  Vaping Use  . Vaping Use: Never used  Substance Use Topics  . Alcohol use: Yes    Alcohol/week: 6.0 - 7.0 standard drinks    Types: 6 - 7 Cans of beer per week    Comment: 6-7 per day  . Drug use: No         OPHTHALMIC EXAM: Base Eye Exam    Visual Acuity (Snellen - Linear)      Right Left   Dist cc 20/20 20/30 -2   Dist ph cc  20/25 -1   Correction: Glasses       Tonometry (Tonopen, 2:33 PM)      Right Left   Pressure 9 12       Pupils      Pupils Dark Light Shape React  APD   Right PERRL 3 2 Round Brisk None   Left PERRL 3 2 Round Brisk None       Visual Fields (Counting fingers)      Left Right    Full Full       Neuro/Psych    Oriented x3: Yes   Mood/Affect: Normal       Dilation    Left eye: 1.0% Mydriacyl, 2.5% Phenylephrine @ 2:33 PM        Slit Lamp and Fundus Exam    External Exam      Right Left   External Normal Normal       Slit Lamp Exam      Right Left   Lids/Lashes Normal Normal   Conjunctiva/Sclera White and quiet White and quiet   Cornea Clear Clear   Anterior Chamber Deep and quiet Deep and quiet   Iris Round and reactive Round and reactive   Lens 2+ Nuclear sclerosis 2+ Nuclear sclerosis   Anterior Vitreous Normal Normal       Fundus Exam      Right Left   Posterior Vitreous Normal Posterior vitreous detachment, Central vitreous floaters   Disc  Normal   C/D Ratio  0.3   Macula  Age related macular degeneration, Early age related macular degeneration,  Choroidal neovascular membrane, Retinal pigment epithelial mottling, Soft drusen, Pigmented atrophy   Vessels  Normal   Periphery  Normal          IMAGING AND PROCEDURES  Imaging and Procedures for 06/20/20  OCT, Retina - OU - Both Eyes       Right Eye Quality was good. Scan locations included subfoveal. Central Foveal Thickness: 373. Progression has improved. Findings include abnormal foveal contour.   Left Eye Quality was good. Scan locations included subfoveal. Central Foveal Thickness: 281. Progression has improved. Findings include abnormal foveal contour, retinal drusen .   Notes OS, region superior temporal to the fovea of chronic intraretinal fluid has minimized and declined over the last 10 weeks intravitreal Avastin  OD appears to be free of active CNVM at this time, some 4 months after most recent injection         Intravitreal Injection, Pharmacologic Agent - OS - Left Eye       Time Out 06/20/2020. 3:16 PM. Confirmed correct  patient, procedure, site, and patient consented.   Anesthesia Topical anesthesia was used. Anesthetic medications  included [place] 3.5%.   Procedure Preparation included 10% betadine to eyelids, 5% betadine to ocular surface, Ofloxacin . A 30 gauge needle was used.   Injection:  1.25 mg Bevacizumab (AVASTIN) SOLN   NDC: [address]-4, Lot: [id number]   Route: Intravitreal, Site: Left Eye, Waste: 0 mg  Post-op Post injection exam found visual acuity of at least counting fingers. The patient tolerated the procedure well. There were no complications. The patient received written and verbal post procedure care education. Post injection medications were not given.                 ASSESSMENT/PLAN:  Nuclear sclerotic cataract of left eye Cataract(s) account for the patient's complaint. I discussed the risks and benefits of cataract surgery. Options were explained to the patient. The patient understands that new glasses may not improve their vision and desires to have cataract surgery. I have recommended follow up with their general eye care doctor for evaluation and consideration of cataract extraction with new intraocular lens insertion.  OS:, I do recommend cataract traction with intraocular lens placement to maximize patient potential vision as he wishes to see the [employer] more clearly.  Exudative age-related macular degeneration of left eye with active choroidal neovascularization (HCC) ARMD, improved and now stable at 10-week interval post Avastin.  We will repeat injection OS today to keep stable and to follow-up OS in 12 weeks  Exudative age-related macular degeneration of right eye with active choroidal neovascularization ([place]) We will continue to monitor the right eye is now 92-month status post most recent injection Avastin.  No active CNVM      ICD-10-CM   1. Exudative age-related macular degeneration of left eye with active choroidal neovascularization (HCC)  H35.3221 OCT,  Retina - OU - Both Eyes    Intravitreal Injection, Pharmacologic Agent - OS - Left Eye    Bevacizumab (AVASTIN) SOLN 1.25 mg  2. Nuclear sclerotic cataract of left eye  H25.12   3. Exudative age-related macular degeneration of right eye with active choroidal neovascularization ([place])  H35.3211     1.  Injection intravitreal Avastin OS today  2.  We will continue to monitor the right eye, now 5 months status post most recent injection, no signs of CNVM  3.  Patient with symptoms that could be from cataract in each eye Inc. should consider surgical intervention OU  Ophthalmic Meds Ordered this visit:  Meds ordered this encounter  Medications  . Bevacizumab (AVASTIN) SOLN 1.25 mg       Return in about 3 months (around 09/19/2020) for DILATE OU, AVASTIN OCT, OS.  Patient Instructions  -Notify the office promptly if new visual acuity declines or distortion  I recommend patient follow-up with Dr. Delton Prairie to consider cataract traction with intraocular lens placement left and right eye to maximize his visual potential as he reports struggling to see the TV screen,  sporting events he enjoys    Explained the diagnoses, plan, and follow up with the patient and they expressed understanding.  Patient expressed understanding of the importance of proper follow up care.   Clent Demark Nargis Abrams M.D. Diseases & Surgery of the Retina and Vitreous [facility]ina [facility] Diabetic [place] 06/20/20     Abbreviations: M myopia (nearsighted); A astigmatism; H hyperopia (farsighted); P presbyopia; Mrx spectacle prescription;  CTL contact lenses; OD right eye; OS left eye; OU both eyes  XT exotropia; ET esotropia; PEK punctate epithelial keratitis; PEE punctate epithelial erosions; DES dry eye syndrome; MGD meibomian gland  dysfunction; ATs artificial tears; PFAT's preservative free artificial tears; [place] nuclear sclerotic cataract; PSC posterior subcapsular cataract; ERM epi-retinal membrane; PVD posterior vitreous  detachment; RD retinal detachment; DM diabetes mellitus; DR diabetic retinopathy; NPDR non-proliferative diabetic retinopathy; PDR proliferative diabetic retinopathy; CSME clinically significant macular edema; DME diabetic macular edema; dbh dot blot hemorrhages; CWS cotton wool spot; POAG primary open angle glaucoma; C/D cup-to-disc ratio; HVF humphrey visual field; GVF goldmann visual field; OCT optical coherence tomography; IOP intraocular pressure; BRVO Branch retinal vein occlusion; CRVO central retinal vein occlusion; CRAO central retinal artery occlusion; BRAO branch retinal artery occlusion; RT retinal tear; SB scleral buckle; PPV pars plana vitrectomy; VH Vitreous hemorrhage; PRP panretinal laser photocoagulation; IVK intravitreal kenalog; VMT vitreomacular traction; MH Macular hole;  NVD neovascularization of the disc; NVE neovascularization elsewhere; AREDS age related eye disease study; ARMD age related macular degeneration; POAG primary open angle glaucoma; EBMD epithelial/anterior basement membrane dystrophy; ACIOL anterior chamber intraocular lens; IOL intraocular lens; PCIOL posterior chamber intraocular lens; Phaco/IOL phacoemulsification with intraocular lens placement; [place] photorefractive keratectomy; LASIK laser assisted in situ keratomileusis; HTN hypertension; DM diabetes mellitus; COPD chronic obstructive pulmonary disease

## 2020-06-20 NOTE — Patient Instructions (Signed)
-  Notify the office promptly if new visual acuity declines or distortion  I recommend patient follow-up with Dr. Delton Prairie to consider cataract traction with intraocular lens placement left and right eye to maximize his visual potential as he reports struggling to see the TV screen,  sporting events he enjoys

## 2020-06-20 NOTE — Assessment & Plan Note (Signed)
We will continue to monitor the right eye is now 43-month status post most recent injection Avastin.  No active CNVM

## 2020-06-23 ENCOUNTER — Encounter: Payer: Self-pay | Admitting: *Deleted

## 2020-07-03 DIAGNOSIS — Z23 Encounter for immunization: Secondary | ICD-10-CM | POA: Diagnosis not present

## 2020-07-04 ENCOUNTER — Ambulatory Visit: Payer: Medicare Other | Admitting: Urology

## 2020-07-07 ENCOUNTER — Other Ambulatory Visit: Payer: Self-pay

## 2020-07-07 DIAGNOSIS — R972 Elevated prostate specific antigen [PSA]: Secondary | ICD-10-CM

## 2020-07-10 DIAGNOSIS — H903 Sensorineural hearing loss, bilateral: Secondary | ICD-10-CM | POA: Diagnosis not present

## 2020-07-10 DIAGNOSIS — H838X3 Other specified diseases of inner ear, bilateral: Secondary | ICD-10-CM | POA: Diagnosis not present

## 2020-07-10 DIAGNOSIS — R42 Dizziness and giddiness: Secondary | ICD-10-CM | POA: Diagnosis not present

## 2020-07-11 ENCOUNTER — Other Ambulatory Visit: Payer: Self-pay

## 2020-07-11 ENCOUNTER — Other Ambulatory Visit: Payer: Medicare Other

## 2020-07-11 DIAGNOSIS — R972 Elevated prostate specific antigen [PSA]: Secondary | ICD-10-CM

## 2020-07-12 LAB — PSA: Prostate Specific Ag, Serum: 16.8 ng/mL — ABNORMAL HIGH (ref 0.0–4.0)

## 2020-07-18 ENCOUNTER — Encounter: Payer: Self-pay | Admitting: Urology

## 2020-07-18 ENCOUNTER — Other Ambulatory Visit: Payer: Self-pay | Admitting: Family Medicine

## 2020-07-18 ENCOUNTER — Other Ambulatory Visit: Payer: Self-pay

## 2020-07-18 ENCOUNTER — Ambulatory Visit (INDEPENDENT_AMBULATORY_CARE_PROVIDER_SITE_OTHER): Payer: Medicare Other | Admitting: Urology

## 2020-07-18 VITALS — BP 121/85 | HR 105 | Temp 98.5°F | Ht 66.5 in | Wt 203.0 lb

## 2020-07-18 DIAGNOSIS — R972 Elevated prostate specific antigen [PSA]: Secondary | ICD-10-CM

## 2020-07-18 LAB — MICROSCOPIC EXAMINATION
Epithelial Cells (non renal): NONE SEEN /hpf (ref 0–10)
Renal Epithel, UA: NONE SEEN /hpf
WBC, UA: NONE SEEN /hpf (ref 0–5)

## 2020-07-18 LAB — URINALYSIS, ROUTINE W REFLEX MICROSCOPIC
Bilirubin, UA: NEGATIVE
Glucose, UA: NEGATIVE
Leukocytes,UA: NEGATIVE
Nitrite, UA: NEGATIVE
Specific Gravity, UA: >1.03 — ABNORMAL HIGH (ref 1.005–1.030)
Urobilinogen, Ur: 1 mg/dL (ref 0.2–1.0)
pH, UA: 5.5 (ref 5.0–7.5)

## 2020-07-18 NOTE — Progress Notes (Signed)
Urological Symptom Review  Patient is experiencing the following symptoms: Get up at night to urinate Erection problems (male only)   Review of Systems  Gastrointestinal (upper)  : Negative for upper GI symptoms  Gastrointestinal (lower) : Negative for lower GI symptoms  Constitutional : Negative for symptoms  Skin: Negative for skin symptoms  Eyes: Negative for eye symptoms  Ear/Nose/Throat : Negative for Ear/Nose/Throat symptoms  Hematologic/Lymphatic: Negative for Hematologic/Lymphatic symptoms  Cardiovascular : Negative for cardiovascular symptoms  Respiratory : Negative for respiratory symptoms  Endocrine: Negative for endocrine symptoms  Musculoskeletal: Negative for musculoskeletal symptoms  Neurological: Negative for neurological symptoms  Psychologic: Anxiety

## 2020-07-18 NOTE — Progress Notes (Signed)
History of Present Illness: This man is here today for follow-up of elevated PSA.  He underwent transrectal ultrasound biopsy on 7.13.2021.  Prostate volume 31.7 mL, PSA 16.5, PSAD 0.52.  All 12 cores were negative for adenocarcinoma.  IPSS 4  QoL score 2 Recent PSA 16.8.  He has had no recent lower urinary tract symptom changes, dysuria or gross hematuria. Past Medical History:  Diagnosis Date  . Anxiety   . Chickenpox   . Depression   . High blood pressure   . High cholesterol   . Sleep apnea     Past Surgical History:  Procedure Laterality Date  . HERNIA REPAIR     abdominal  1998 2008, inguinal  . TONSILLECTOMY      Home Medications:    Allergies: No Known Allergies  Family History  Problem Relation Age of Onset  . Emphysema Mother   . Liver cancer Brother   . Stroke Maternal Uncle   . Stroke Paternal Uncle   . Heart attack Maternal Grandfather   . Colon cancer Son   . Kidney cancer Son     Social History:  reports that he has never smoked. He has never used smokeless tobacco. He reports current alcohol use of about 6.0 - 7.0 standard drinks of alcohol per week. He reports that he does not use drugs.  ROS: A complete review of systems was performed.  All systems are negative except for pertinent findings as noted.  Physical Exam:  Vital signs in last 24 hours: There were no vitals taken for this visit. Constitutional:  Alert and oriented, No acute distress Cardiovascular: Regular rate  Respiratory: Normal respiratory effort Neurologic: Grossly intact, no focal deficits Psychiatric: Normal mood and affect  I have reviewed prior pt notes  I have reviewed notes from referring/previous physicians  I have reviewed urinalysis results  I have independently reviewed prior imaging  I have reviewed prior PSA results Impression/Assessment:  1.  Elevated PSA with negative biopsy earlier this year with increasing PSA trend.  He has a normal digital rectal  exam  2.  ED, on sildenafil  Plan:  I discussed with the patient further evaluation.  At this point, I think proceeding with an MRI of his prostate is the most worthwhile plan.  We will check BUN and creatinine today  If abnormality on MRI, we will perform fusion biopsy.  If negative, continue antibiotic trial and repeat PSA

## 2020-07-19 LAB — BUN+CREAT
BUN/Creatinine Ratio: 6 — ABNORMAL LOW (ref 10–24)
BUN: 6 mg/dL — ABNORMAL LOW (ref 8–27)
Creatinine, Ser: 1.05 mg/dL (ref 0.76–1.27)
GFR calc Af Amer: 82 mL/min/{1.73_m2} (ref >59)
GFR calc non Af Amer: 71 mL/min/{1.73_m2} (ref >59)

## 2020-07-20 ENCOUNTER — Ambulatory Visit: Payer: Medicare Other | Admitting: Family Medicine

## 2020-07-20 ENCOUNTER — Telehealth: Payer: Self-pay

## 2020-07-20 NOTE — Telephone Encounter (Signed)
I do not see on Med list. This will have to wait for his PCP to approve.

## 2020-07-20 NOTE — Progress Notes (Signed)
Letter mailed

## 2020-07-20 NOTE — Telephone Encounter (Signed)
Medication not on current med list. Last Ov 06/09/20 next Ov 07/27/20

## 2020-07-21 ENCOUNTER — Telehealth: Payer: Self-pay

## 2020-07-21 NOTE — Telephone Encounter (Signed)
Patient aware that [facility] will review on Monday.

## 2020-07-21 NOTE — Telephone Encounter (Signed)
Attempted to contact - NA 

## 2020-07-21 NOTE — Telephone Encounter (Signed)
He reported side effects from that and ciallis back in April. Is he sure hewants to take that chance again?

## 2020-07-27 ENCOUNTER — Ambulatory Visit: Payer: Medicare Other | Admitting: Family Medicine

## 2020-07-28 ENCOUNTER — Other Ambulatory Visit: Payer: Self-pay | Admitting: Family Medicine

## 2020-07-28 DIAGNOSIS — F411 Generalized anxiety disorder: Secondary | ICD-10-CM

## 2020-07-31 DIAGNOSIS — Z0000 Encounter for general adult medical examination without abnormal findings: Secondary | ICD-10-CM | POA: Diagnosis not present

## 2020-07-31 DIAGNOSIS — Z87898 Personal history of other specified conditions: Secondary | ICD-10-CM | POA: Diagnosis not present

## 2020-07-31 DIAGNOSIS — Z131 Encounter for screening for diabetes mellitus: Secondary | ICD-10-CM | POA: Diagnosis not present

## 2020-07-31 DIAGNOSIS — E785 Hyperlipidemia, unspecified: Secondary | ICD-10-CM | POA: Diagnosis not present

## 2020-07-31 DIAGNOSIS — Z1159 Encounter for screening for other viral diseases: Secondary | ICD-10-CM | POA: Diagnosis not present

## 2020-07-31 DIAGNOSIS — E559 Vitamin D deficiency, unspecified: Secondary | ICD-10-CM | POA: Diagnosis not present

## 2020-07-31 DIAGNOSIS — Z79899 Other long term (current) drug therapy: Secondary | ICD-10-CM | POA: Diagnosis not present

## 2020-08-01 DIAGNOSIS — Z23 Encounter for immunization: Secondary | ICD-10-CM | POA: Diagnosis not present

## 2020-08-04 NOTE — Telephone Encounter (Signed)
Spoke with patient, he said for now he does not want a prescription for this medication.

## 2020-08-08 DIAGNOSIS — H35371 Puckering of macula, right eye: Secondary | ICD-10-CM | POA: Diagnosis not present

## 2020-08-08 DIAGNOSIS — H2513 Age-related nuclear cataract, bilateral: Secondary | ICD-10-CM | POA: Diagnosis not present

## 2020-08-08 DIAGNOSIS — H3530 Unspecified macular degeneration: Secondary | ICD-10-CM | POA: Diagnosis not present

## 2020-08-08 DIAGNOSIS — H43813 Vitreous degeneration, bilateral: Secondary | ICD-10-CM | POA: Diagnosis not present

## 2020-08-09 ENCOUNTER — Other Ambulatory Visit: Payer: Self-pay | Admitting: Family Medicine

## 2020-08-09 DIAGNOSIS — K219 Gastro-esophageal reflux disease without esophagitis: Secondary | ICD-10-CM

## 2020-08-13 ENCOUNTER — Ambulatory Visit
Admission: RE | Admit: 2020-08-13 | Discharge: 2020-08-13 | Disposition: A | Discharge status: Home / Self Care | Payer: Medicare Other | Source: Ambulatory Visit | Attending: Urology | Admitting: Urology

## 2020-08-13 DIAGNOSIS — C61 Malignant neoplasm of prostate: Secondary | ICD-10-CM | POA: Diagnosis not present

## 2020-08-13 DIAGNOSIS — R972 Elevated prostate specific antigen [PSA]: Secondary | ICD-10-CM

## 2020-08-13 MED ORDER — GADOBENATE DIMEGLUMINE 529 MG/ML IV SOLN
20.0000 mL | Freq: Once | INTRAVENOUS | Status: AC | PRN
  Administered 2020-08-13: 20 mL via INTRAVENOUS

## 2020-08-14 DIAGNOSIS — E559 Vitamin D deficiency, unspecified: Secondary | ICD-10-CM | POA: Diagnosis not present

## 2020-08-14 DIAGNOSIS — R7303 Prediabetes: Secondary | ICD-10-CM | POA: Diagnosis not present

## 2020-08-14 DIAGNOSIS — F419 Anxiety disorder, unspecified: Secondary | ICD-10-CM | POA: Diagnosis not present

## 2020-08-14 DIAGNOSIS — R972 Elevated prostate specific antigen [PSA]: Secondary | ICD-10-CM | POA: Diagnosis not present

## 2020-08-15 ENCOUNTER — Other Ambulatory Visit: Payer: Self-pay | Admitting: Family Medicine

## 2020-08-21 ENCOUNTER — Telehealth: Payer: Self-pay | Admitting: Urology

## 2020-08-21 NOTE — Telephone Encounter (Signed)
Pt was calling about getting the results of his MRI that he had done.

## 2020-08-22 ENCOUNTER — Other Ambulatory Visit: Payer: Self-pay | Admitting: Urology

## 2020-08-22 ENCOUNTER — Telehealth: Payer: Self-pay

## 2020-08-22 DIAGNOSIS — R972 Elevated prostate specific antigen [PSA]: Secondary | ICD-10-CM

## 2020-08-22 NOTE — Telephone Encounter (Signed)
Appts scheduled and pt notified of results and new appts.

## 2020-08-22 NOTE — Telephone Encounter (Signed)
-----   Message from Franchot Gallo, MD sent at 08/22/2020 12:20 PM EST ----- Please call pt--good news MRI looks nml--no need for repeat Bx @ present--would like to see him back in 3 mos following PSA (order put in) ----- Message ----- From: Valentina Lucks, LPN Sent: [id number]/5866   1:22 PM EST To: Franchot Gallo, MD  Pls review.

## 2020-08-25 DIAGNOSIS — H25812 Combined forms of age-related cataract, left eye: Secondary | ICD-10-CM | POA: Diagnosis not present

## 2020-08-30 ENCOUNTER — Other Ambulatory Visit: Payer: Self-pay | Admitting: Family Medicine

## 2020-08-30 DIAGNOSIS — E782 Mixed hyperlipidemia: Secondary | ICD-10-CM

## 2020-09-03 DIAGNOSIS — H2511 Age-related nuclear cataract, right eye: Secondary | ICD-10-CM | POA: Diagnosis not present

## 2020-09-11 DIAGNOSIS — H25811 Combined forms of age-related cataract, right eye: Secondary | ICD-10-CM | POA: Diagnosis not present

## 2020-09-17 DIAGNOSIS — K8020 Calculus of gallbladder without cholecystitis without obstruction: Secondary | ICD-10-CM | POA: Diagnosis not present

## 2020-09-17 DIAGNOSIS — R590 Localized enlarged lymph nodes: Secondary | ICD-10-CM | POA: Diagnosis not present

## 2020-09-17 DIAGNOSIS — J9811 Atelectasis: Secondary | ICD-10-CM | POA: Diagnosis not present

## 2020-09-17 DIAGNOSIS — I700 Atherosclerosis of aorta: Secondary | ICD-10-CM | POA: Diagnosis not present

## 2020-09-17 DIAGNOSIS — J209 Acute bronchitis, unspecified: Secondary | ICD-10-CM | POA: Diagnosis not present

## 2020-09-17 DIAGNOSIS — R059 Cough, unspecified: Secondary | ICD-10-CM | POA: Diagnosis not present

## 2020-09-17 DIAGNOSIS — J984 Other disorders of lung: Secondary | ICD-10-CM | POA: Diagnosis not present

## 2020-09-17 DIAGNOSIS — I2510 Atherosclerotic heart disease of native coronary artery without angina pectoris: Secondary | ICD-10-CM | POA: Diagnosis not present

## 2020-09-19 ENCOUNTER — Encounter (INDEPENDENT_AMBULATORY_CARE_PROVIDER_SITE_OTHER): Payer: Medicare Other | Admitting: Ophthalmology

## 2020-09-21 ENCOUNTER — Encounter (INDEPENDENT_AMBULATORY_CARE_PROVIDER_SITE_OTHER): Payer: Self-pay | Admitting: Ophthalmology

## 2020-09-21 ENCOUNTER — Other Ambulatory Visit: Payer: Self-pay

## 2020-09-21 ENCOUNTER — Ambulatory Visit (INDEPENDENT_AMBULATORY_CARE_PROVIDER_SITE_OTHER): Payer: Medicare Other | Admitting: Ophthalmology

## 2020-09-21 DIAGNOSIS — H353221 Exudative age-related macular degeneration, left eye, with active choroidal neovascularization: Secondary | ICD-10-CM | POA: Diagnosis not present

## 2020-09-21 DIAGNOSIS — H35371 Puckering of macula, right eye: Secondary | ICD-10-CM

## 2020-09-21 DIAGNOSIS — H353212 Exudative age-related macular degeneration, right eye, with inactive choroidal neovascularization: Secondary | ICD-10-CM | POA: Diagnosis not present

## 2020-09-21 DIAGNOSIS — Z961 Presence of intraocular lens: Secondary | ICD-10-CM | POA: Insufficient documentation

## 2020-09-21 MED ORDER — BEVACIZUMAB 2.5 MG/0.1ML IZ SOSY
2.5000 mg | PREFILLED_SYRINGE | INTRAVITREAL | Status: AC | PRN
  Administered 2020-09-21: 2.5 mg via INTRAVITREAL

## 2020-09-21 NOTE — Assessment & Plan Note (Signed)
Minor none foveal epiretinal membrane, will observe no changes

## 2020-09-21 NOTE — Assessment & Plan Note (Signed)
OS stable at 25-month follow-up interval chronic active area superotemporal to the FAZ with intraretinal fluid CME, stable, repeat Avastin today

## 2020-09-21 NOTE — Assessment & Plan Note (Signed)
OD, off therapy now for 7 months, stable lesion

## 2020-09-21 NOTE — Progress Notes (Signed)
09/21/2020     CHIEF COMPLAINT Patient presents for Retina Follow Up (3 Month F/U OU, poss Avastin OS//Pt sts he is able to see better following cataract surgery OU. Pt denies ocular pain. Pt sts reading [facility] is still blurry.)   HISTORY OF PRESENT ILLNESS: Marvin White is a 74 y.o. male who presents to the clinic today for:   HPI    Retina Follow Up    Patient presents with  Wet AMD.  In left eye.  This started 3 months ago.  Severity is mild.  Duration of 3 months.  Since onset it is gradually improving. Additional comments: 3 Month F/U OU, poss Avastin OS  Pt sts he is able to see better following cataract surgery OU. Pt denies ocular pain. Pt sts reading [facility] is still blurry.       Last edited by Marvin White, COA on 09/21/2020  1:36 PM. (History)      Referring physician: Mechele Claude, MD [address] [place],  [place] [address]  HISTORICAL INFORMATION:   Selected notes from the MEDICAL RECORD NUMBER       CURRENT MEDICATIONS: Current Outpatient Medications (Ophthalmic Drugs)  Medication Sig  . prednisoLONE acetate (PRED FORTE) 1 % ophthalmic suspension 1 drop 4 (four) times daily.  Marland Kitchen PROLENSA 0.07 % SOLN Apply 1 drop to eye daily.   No current facility-administered medications for this visit. (Ophthalmic Drugs)   Current Outpatient Medications (Other)  Medication Sig  . fluticasone (FLONASE) 50 MCG/ACT nasal spray USE TWO SPRAYS IN EACH NOSTRIL TWICE DAILY.  Marland Kitchen amoxicillin-clavulanate (AUGMENTIN) 875-125 MG tablet Take 1 tablet by mouth 2 (two) times daily. Take all of this medication (Patient not taking: Reported on 07/18/2020)  . Apoaequorin (PREVAGEN) 10 MG CAPS Take 1 tablet by mouth daily. (Patient not taking: Reported on 06/09/2020)  . atorvastatin (LIPITOR) 40 MG tablet Take 1 tablet (40 mg total) by mouth daily. For cholesterol  . b complex vitamins tablet Take 1 tablet by mouth daily.  . ciprofloxacin (CIPRO) 500 MG tablet Take 1 tablet (500 mg total) by  mouth 2 (two) times daily. (Patient not taking: Reported on 07/18/2020)  . citalopram (CELEXA) 20 MG tablet Take 1 tablet (20 mg total) by mouth daily.  . clonazePAM (KLONOPIN) 0.5 MG tablet Take 1 tablet (0.5 mg total) by mouth at bedtime.  . fenofibrate (TRICOR) 48 MG tablet Take 1 tablet (48 mg total) by mouth daily. (Needs to be seen before next refill)  . fexofenadine-pseudoephedrine (ALLEGRA-D 24) 180-240 MG 24 hr tablet Take 1 tablet by mouth every evening. For allergy and congestion  . metFORMIN (GLUCOPHAGE) 500 MG tablet Take 500 mg by mouth 2 (two) times daily.  . NEOMYCIN-POLYMYXIN-HYDROCORTISONE (CORTISPORIN) 1 % SOLN OTIC solution Place 3 drops into both ears 4 (four) times daily. (Patient not taking: Reported on 07/18/2020)  . pantoprazole (PROTONIX) 40 MG tablet TAKE ONE TABLET BY MOUTH ONCE DAILY  . potassium citrate (UROCIT-K) 10 MEQ (1080 MG) SR tablet Take 2 tablets (20 mEq total) by mouth 3 (three) times daily with meals.  . terazosin (HYTRIN) 2 MG capsule TAKE ONE CAPSULE BY MOUTH AT BEDTIME   No current facility-administered medications for this visit. (Other)      REVIEW OF SYSTEMS:    ALLERGIES No Known Allergies  PAST MEDICAL HISTORY Past Medical History:  Diagnosis Date  . Anxiety   . Chickenpox   . Depression   . High blood pressure   . High cholesterol   .  Nuclear sclerotic cataract of left eye 12/27/2019  . Nuclear sclerotic cataract of right eye 05/10/2020  . Sleep apnea    Past Surgical History:  Procedure Laterality Date  . HERNIA REPAIR     abdominal  1998 2008, inguinal  . TONSILLECTOMY      FAMILY HISTORY Family History  Problem Relation Age of Onset  . Emphysema Mother   . Liver cancer Brother   . Stroke Maternal Uncle   . Stroke Paternal Uncle   . Heart attack Maternal Grandfather   . Colon cancer Son   . Kidney cancer Son     SOCIAL HISTORY Social History   Tobacco Use  . Smoking status: Never Smoker  . Smokeless tobacco:  Never Used  Vaping Use  . Vaping Use: Never used  Substance Use Topics  . Alcohol use: Yes    Alcohol/week: 6.0 - 7.0 standard drinks    Types: 6 - 7 Cans of beer per week    Comment: 6-7 per day  . Drug use: No         OPHTHALMIC EXAM: Base Eye Exam    Visual Acuity (ETDRS)      Right Left   Dist [place] 20/20 -2 20/30 -1   Dist ph   20/25 +2       Tonometry (Tonopen, 1:37 PM)      Right Left   Pressure 06 10       Pupils      Pupils Dark Light Shape React APD   Right PERRL 3 2 Round Brisk None   Left PERRL 3 2 Round Brisk None       Visual Fields (Counting fingers)      Left Right    Full Full       Extraocular Movement      Right Left    Full Full       Neuro/Psych    Oriented x3: Yes   Mood/Affect: Normal       Dilation    Both eyes: 1.0% Mydriacyl, 2.5% Phenylephrine @ 1:42 PM        Slit Lamp and Fundus Exam    External Exam      Right Left   External Normal Normal       Slit Lamp Exam      Right Left   Lids/Lashes Normal Normal   Conjunctiva/Sclera White and quiet White and quiet   Cornea Clear Clear   Anterior Chamber Deep and quiet Deep and quiet   Iris Round and reactive Round and reactive   Lens Centered posterior chamber intraocular lens Centered posterior chamber intraocular lens   Anterior Vitreous Normal Normal       Fundus Exam      Right Left   Posterior Vitreous Normal Posterior vitreous detachment, Central vitreous floaters   Disc Normal Normal   C/D Ratio 0.5 0.3   Macula Early age related macular degeneration, Retinal pigment epithelial atrophy, Hard drusen, no hemorrhage, Retinal pigment epithelial mottling Age related macular degeneration, Early age related macular degeneration,   Inactive Choroidal neovascular membrane, Retinal pigment epithelial mottling, Soft drusen, Pigmented atrophy   Vessels  Normal   Periphery  Normal          IMAGING AND PROCEDURES  Imaging and Procedures for 09/21/20  OCT, Retina - OU -  Both Eyes       Right Eye Quality was good. Scan locations included subfoveal. Central Foveal Thickness: 371. Progression has been stable. Findings include  abnormal foveal contour, no SRF, no IRF, subretinal hyper-reflective material, pigment epithelial detachment, epiretinal membrane.   Left Eye Quality was good. Scan locations included subfoveal. Central Foveal Thickness: 282. Progression has been stable. Findings include abnormal foveal contour, no IRF, no SRF, pigment epithelial detachment, cystoid macular edema.   Notes OD continues to be quiescent without signs of CNVM complication with subfoveal old fibrotic PED.  No treatment now for 7 months  OS with chronic active region superotemporally, old rap-like shunt lesion, stable though at 60-month interval repeat injection today       Intravitreal Injection, Pharmacologic Agent - OS - Left Eye       Time Out 09/21/2020. 2:31 PM. Confirmed correct patient, procedure, site, and patient consented.   Anesthesia Topical anesthesia was used. Anesthetic medications included Ak[place] 3.5%.   Procedure Preparation included 10% betadine to eyelids, 5% betadine to ocular surface, Ofloxacin . A 30 gauge needle was used.   Injection:  2.5 mg Bevacizumab (AVASTIN) 2.5mg /0.62mL SOSY   NDC: 10272-536-64, Lot: [id number]   Route: Intravitreal, Site: Left Eye  Post-op Post injection exam found visual acuity of at least counting fingers. The patient tolerated the procedure well. There were no complications. The patient received written and verbal post procedure care education. Post injection medications were not given.                 ASSESSMENT/PLAN:  Exudative age-related macular degeneration of right eye with inactive choroidal neovascularization (HCC) OD, off therapy now for 7 months, stable lesion  Exudative age-related macular degeneration of left eye with active choroidal neovascularization (HCC) OS stable at 72-month follow-up  interval chronic active area superotemporal to the FAZ with intraretinal fluid CME, stable, repeat Avastin today  Macular pucker, right eye Minor none foveal epiretinal membrane, will observe no changes      ICD-10-CM   1. Exudative age-related macular degeneration of left eye with active choroidal neovascularization (HCC)  H35.3221 OCT, Retina - OU - Both Eyes    Intravitreal Injection, Pharmacologic Agent - OS - Left Eye    bevacizumab (AVASTIN) SOSY 2.5 mg  2. Exudative age-related macular degeneration of right eye with inactive choroidal neovascularization ([place])  H35.3212   3. Macular pucker, right eye  H35.371   4. Pseudophakia of both eyes  Z96.1     1.  OS, controlled and stable CNVM superotemporal to the FAZ, rap-like lesion, repeat Avastin today to control  2.  OD remains stable, no treatment now for 7.5 months will observe  3.  Ophthalmic Meds Ordered this visit:  Meds ordered this encounter  Medications  . bevacizumab (AVASTIN) SOSY 2.5 mg       Return in about 3 months (around 12/20/2020) for DILATE OU, AVASTIN OCT, OS.  There are no Patient Instructions on file for this visit.   Explained the diagnoses, plan, and follow up with the patient and they expressed understanding.  Patient expressed understanding of the importance of proper follow up care.   Clent Demark Onis Markoff M.D. Diseases & Surgery of the Retina and Vitreous [facility] 09/21/20     Abbreviations: M myopia (nearsighted); A astigmatism; H hyperopia (farsighted); P presbyopia; Mrx spectacle prescription;  CTL contact lenses; OD right eye; OS left eye; OU both eyes  XT exotropia; ET esotropia; PEK punctate epithelial keratitis; PEE punctate epithelial erosions; DES dry eye syndrome; MGD meibomian gland dysfunction; ATs artificial tears; PFAT's preservative free artificial tears; [place] nuclear sclerotic cataract; PSC posterior subcapsular cataract; ERM  epi-retinal membrane; PVD posterior  vitreous detachment; RD retinal detachment; DM diabetes mellitus; DR diabetic retinopathy; NPDR non-proliferative diabetic retinopathy; PDR proliferative diabetic retinopathy; CSME clinically significant macular edema; DME diabetic macular edema; dbh dot blot hemorrhages; CWS cotton wool spot; POAG primary open angle glaucoma; C/D cup-to-disc ratio; HVF humphrey visual field; GVF goldmann visual field; OCT optical coherence tomography; IOP intraocular pressure; BRVO Branch retinal vein occlusion; CRVO central retinal vein occlusion; CRAO central retinal artery occlusion; BRAO branch retinal artery occlusion; RT retinal tear; SB scleral buckle; PPV pars plana vitrectomy; VH Vitreous hemorrhage; PRP panretinal laser photocoagulation; IVK intravitreal kenalog; VMT vitreomacular traction; MH Macular hole;  NVD neovascularization of the disc; NVE neovascularization elsewhere; AREDS age related eye disease study; ARMD age related macular degeneration; POAG primary open angle glaucoma; EBMD epithelial/anterior basement membrane dystrophy; ACIOL anterior chamber intraocular lens; IOL intraocular lens; PCIOL posterior chamber intraocular lens; Phaco/IOL phacoemulsification with intraocular lens placement; PRK photorefractive keratectomy; LASIK laser assisted in situ keratomileusis; HTN hypertension; DM diabetes mellitus; COPD chronic obstructive pulmonary disease

## 2020-10-30 ENCOUNTER — Other Ambulatory Visit: Payer: Self-pay | Admitting: Family Medicine

## 2020-11-14 ENCOUNTER — Other Ambulatory Visit: Payer: Medicare Other

## 2020-11-14 DIAGNOSIS — E785 Hyperlipidemia, unspecified: Secondary | ICD-10-CM | POA: Diagnosis not present

## 2020-11-14 DIAGNOSIS — E559 Vitamin D deficiency, unspecified: Secondary | ICD-10-CM | POA: Diagnosis not present

## 2020-11-14 DIAGNOSIS — Z114 Encounter for screening for human immunodeficiency virus [HIV]: Secondary | ICD-10-CM | POA: Diagnosis not present

## 2020-11-14 DIAGNOSIS — Z87898 Personal history of other specified conditions: Secondary | ICD-10-CM | POA: Diagnosis not present

## 2020-11-14 DIAGNOSIS — Z0000 Encounter for general adult medical examination without abnormal findings: Secondary | ICD-10-CM | POA: Diagnosis not present

## 2020-11-15 ENCOUNTER — Other Ambulatory Visit: Payer: Self-pay

## 2020-11-15 ENCOUNTER — Other Ambulatory Visit: Payer: Medicare Other

## 2020-11-15 DIAGNOSIS — R972 Elevated prostate specific antigen [PSA]: Secondary | ICD-10-CM

## 2020-11-16 LAB — PSA: Prostate Specific Ag, Serum: 14.9 ng/mL — ABNORMAL HIGH (ref 0.0–4.0)

## 2020-11-21 ENCOUNTER — Ambulatory Visit (INDEPENDENT_AMBULATORY_CARE_PROVIDER_SITE_OTHER): Payer: Medicare Other | Admitting: Urology

## 2020-11-21 ENCOUNTER — Encounter: Payer: Self-pay | Admitting: Urology

## 2020-11-21 ENCOUNTER — Other Ambulatory Visit: Payer: Self-pay

## 2020-11-21 VITALS — BP 127/78 | HR 101 | Temp 98.8°F | Ht 65.0 in | Wt 195.0 lb

## 2020-11-21 DIAGNOSIS — R972 Elevated prostate specific antigen [PSA]: Secondary | ICD-10-CM | POA: Diagnosis not present

## 2020-11-21 DIAGNOSIS — N5201 Erectile dysfunction due to arterial insufficiency: Secondary | ICD-10-CM | POA: Diagnosis not present

## 2020-11-21 LAB — URINALYSIS, ROUTINE W REFLEX MICROSCOPIC
Bilirubin, UA: NEGATIVE
Glucose, UA: NEGATIVE
Leukocytes,UA: NEGATIVE
Nitrite, UA: NEGATIVE
Protein,UA: NEGATIVE
RBC, UA: NEGATIVE
Specific Gravity, UA: 1.02 (ref 1.005–1.030)
Urobilinogen, Ur: 2 mg/dL — ABNORMAL HIGH (ref 0.2–1.0)
pH, UA: 5.5 (ref 5.0–7.5)

## 2020-11-21 NOTE — Progress Notes (Signed)

## 2020-11-21 NOTE — Progress Notes (Signed)
History of Present Illness: Here for follow-up of elevated PSA.  He underwent transrectal ultrasound biopsy on 7.13.2021.  Prostate volume 31.7 mL, PSA 16.5, PSAD 0.52.  All 12 cores were negative for adenocarcinoma.  PSA levels since--  10.19.2021--16.8 2.23.2022--14.9  MRI prostate 11.21.2022:  -- Peripheral Zone: No abnormality seen on ADC and high b-value DWI sequences. -- Transition/Central Zone: Circumscribed BPH nodules are noted, but no suspicious nodules with obscured or non-circumscribed margins seen. -- Measurements/Volume:  3.9 x 3.7 x 4.9 cm (volume = 37 cm^3) Transcapsular spread:  Absent Seminal vesicle involvement:  Absent Neurovascular bundle involvement:  Absent Pelvic adenopathy: None visualized Bone metastasis: None visualized Other:  None IMPRESSION: No radiographic evidence of high-grade prostate carcinoma. PI-RADS 1: Very Low (clinically significant cancer is highly unlikely to be present)  3.1.2022: No recent urinary difficulties.  IPSS 3, quality-of-life 1  He is on sildenafil, uses 100 mg at a time with minimal improvement.  He does have nausea and headache from this.  Past Medical History:  Diagnosis Date  . Anxiety   . Chickenpox   . Depression   . High blood pressure   . High cholesterol   . Nuclear sclerotic cataract of left eye 12/27/2019  . Nuclear sclerotic cataract of right eye 05/10/2020  . Sleep apnea     Past Surgical History:  Procedure Laterality Date  . HERNIA REPAIR     abdominal  1998 2008, inguinal  . TONSILLECTOMY      Home Medications:  Allergies as of 11/21/2020   No Known Allergies     Medication List       Accurate as of November 21, 2020 10:47 AM. If you have any questions, ask your nurse or doctor.        amoxicillin-clavulanate 875-125 MG tablet Commonly known as: AUGMENTIN Take 1 tablet by mouth 2 (two) times daily. Take all of this medication   atorvastatin 40 MG tablet Commonly known as: LIPITOR Take 1  tablet (40 mg total) by mouth daily. For cholesterol   b complex vitamins tablet Take 1 tablet by mouth daily.   ciprofloxacin 500 MG tablet Commonly known as: Cipro Take 1 tablet (500 mg total) by mouth 2 (two) times daily.   citalopram 20 MG tablet Commonly known as: CELEXA Take 1 tablet (20 mg total) by mouth daily.   clonazePAM 0.5 MG tablet Commonly known as: KLONOPIN Take 1 tablet (0.5 mg total) by mouth at bedtime.   fenofibrate 48 MG tablet Commonly known as: TRICOR Take 1 tablet (48 mg total) by mouth daily. (Needs to be seen before next refill)   fexofenadine-pseudoephedrine 180-240 MG 24 hr tablet Commonly known as: ALLEGRA-D 24 Take 1 tablet by mouth every evening. For allergy and congestion   fluticasone 50 MCG/ACT nasal spray Commonly known as: FLONASE USE TWO SPRAYS IN EACH NOSTRIL TWICE DAILY.   metFORMIN 500 MG tablet Commonly known as: GLUCOPHAGE Take 500 mg by mouth 2 (two) times daily.   NEOMYCIN-POLYMYXIN-HYDROCORTISONE 1 % Soln OTIC solution Commonly known as: CORTISPORIN Place 3 drops into both ears 4 (four) times daily.   pantoprazole 40 MG tablet Commonly known as: PROTONIX TAKE ONE TABLET BY MOUTH ONCE DAILY   potassium citrate 10 MEQ (1080 MG) SR tablet Commonly known as: UROCIT-K Take 2 tablets (20 mEq total) by mouth 3 (three) times daily with meals.   prednisoLONE acetate 1 % ophthalmic suspension Commonly known as: PRED FORTE 1 drop 4 (four) times daily.   Prevagen 10 MG  Caps Generic drug: Apoaequorin Take 1 tablet by mouth daily.   Prolensa 0.07 % Soln Generic drug: Bromfenac Sodium Apply 1 drop to eye daily.   terazosin 2 MG capsule Commonly known as: HYTRIN TAKE ONE CAPSULE BY MOUTH AT BEDTIME       Allergies: No Known Allergies  Family History  Problem Relation Age of Onset  . Emphysema Mother   . Liver cancer Brother   . Stroke Maternal Uncle   . Stroke Paternal Uncle   . Heart attack Maternal Grandfather   .  Colon cancer Son   . Kidney cancer Son     Social History:  reports that he has never smoked. He has never used smokeless tobacco. He reports current alcohol use of about 6.0 - 7.0 standard drinks of alcohol per week. He reports that he does not use drugs.  ROS: A complete review of systems was performed.  All systems are negative except for pertinent findings as noted.  Physical Exam:  Vital signs in last 24 hours: There were no vitals taken for this visit. Constitutional:  Alert and oriented, No acute distress Cardiovascular: Regular rate  Respiratory: Normal respiratory effort GI: Abdomen is soft, nontender, nondistended, no abdominal masses. No CVAT.  No inguinal hernias Genitourinary: Normal male phallus, testes are descended bilaterally and non-tender and without masses, scrotum is normal in appearance without lesions or masses, perineum is normal on inspection.  Prostate 40 mL, symmetrical, no nodules. Lymphatic: No lymphadenopathy Neurologic: Grossly intact, no focal deficits Psychiatric: Normal mood and affect  I have reviewed prior pt notes  I have reviewed notes from referring/previous physicians  I have reviewed urinalysis results  I have independently reviewed prior imaging--MRI results  I have reviewed prior PSA results   Impression/Assessment:  1.  Elevated PSA, now 14.9--down some with negative biopsy and MRI in 2021  2.  ED, worsening  Plan:  1.  I gave him a prescription for tadalafil 20 mg to use  2.  If that does not work, we will initiate prostaglandin injections  3.  I will see back in 6 months with PSA

## 2020-12-21 ENCOUNTER — Ambulatory Visit (INDEPENDENT_AMBULATORY_CARE_PROVIDER_SITE_OTHER): Payer: Medicare Other | Admitting: Ophthalmology

## 2020-12-21 ENCOUNTER — Encounter (INDEPENDENT_AMBULATORY_CARE_PROVIDER_SITE_OTHER): Payer: Self-pay | Admitting: Ophthalmology

## 2020-12-21 ENCOUNTER — Other Ambulatory Visit: Payer: Self-pay

## 2020-12-21 DIAGNOSIS — H353212 Exudative age-related macular degeneration, right eye, with inactive choroidal neovascularization: Secondary | ICD-10-CM | POA: Diagnosis not present

## 2020-12-21 DIAGNOSIS — H353221 Exudative age-related macular degeneration, left eye, with active choroidal neovascularization: Secondary | ICD-10-CM | POA: Diagnosis not present

## 2020-12-21 MED ORDER — BEVACIZUMAB CHEMO INJECTION 1.25MG/0.05ML SYRINGE FOR KALEIDOSCOPE
1.2500 mg | INTRAVITREAL | Status: AC | PRN
  Administered 2020-12-21: 1.25 mg via INTRAVITREAL

## 2020-12-21 NOTE — Assessment & Plan Note (Signed)
OD lesion still quiescent at 10 months post last injection

## 2020-12-21 NOTE — Progress Notes (Signed)
12/21/2020     CHIEF COMPLAINT Patient presents for Retina Follow Up (3 Mo F/U OU, poss Avastin OS//Pt denies noticeable changes to [place] OU since last visit. Pt denies ocular pain, flashes of light, or floaters OU. //)   HISTORY OF PRESENT ILLNESS: Marvin White is a 75 y.o. male who presents to the clinic today for:   HPI    Retina Follow Up    Patient presents with  Wet AMD.  In left eye.  This started 3 months ago.  Severity is mild.  Duration of 3 months.  Since onset it is stable. Additional comments: 3 Mo F/U OU, poss Avastin OS  Pt denies noticeable changes to [place] OU since last visit. Pt denies ocular pain, flashes of light, or floaters OU.          Last edited by Rockie Neighbours, [place] on 12/21/2020 12:58 PM. (History)      Referring physician: Claretta Fraise, MD [place],  [place] [address]  HISTORICAL INFORMATION:   Selected notes from the MEDICAL RECORD NUMBER       CURRENT MEDICATIONS: Current Outpatient Medications (Ophthalmic Drugs)  Medication Sig  . prednisoLONE acetate (PRED FORTE) 1 % ophthalmic suspension 1 drop 4 (four) times daily. (Patient not taking: Reported on 11/21/2020)  . PROLENSA 0.07 % SOLN Apply 1 drop to eye daily. (Patient not taking: Reported on 12/21/2020)   No current facility-administered medications for this visit. (Ophthalmic Drugs)   Current Outpatient Medications (Other)  Medication Sig  . ALLERGY RELIEF 180 MG tablet Take 180 mg by mouth daily.  Marland Kitchen amoxicillin-clavulanate (AUGMENTIN) 875-125 MG tablet Take 1 tablet by mouth 2 (two) times daily. Take all of this medication (Patient not taking: No sig reported)  . Apoaequorin (PREVAGEN) 10 MG CAPS Take 1 tablet by mouth daily.  Marland Kitchen atorvastatin (LIPITOR) 40 MG tablet Take 1 tablet (40 mg total) by mouth daily. For cholesterol  . b complex vitamins tablet Take 1 tablet by mouth daily.  . Cholecalciferol (VITAMIN D3) 1.25 MG (50000 UT) CAPS Take 1 capsule by mouth once a week.  .  ciprofloxacin (CIPRO) 500 MG tablet Take 1 tablet (500 mg total) by mouth 2 (two) times daily. (Patient not taking: No sig reported)  . citalopram (CELEXA) 20 MG tablet Take 1 tablet (20 mg total) by mouth daily.  . clonazePAM (KLONOPIN) 0.5 MG tablet Take 1 tablet (0.5 mg total) by mouth at bedtime. (Patient not taking: Reported on 11/21/2020)  . fenofibrate (TRICOR) 48 MG tablet Take 1 tablet (48 mg total) by mouth daily. (Needs to be seen before next refill)  . fexofenadine-pseudoephedrine (ALLEGRA-D 24) 180-240 MG 24 hr tablet Take 1 tablet by mouth every evening. For allergy and congestion  . fluticasone (FLONASE) 50 MCG/ACT nasal spray USE TWO SPRAYS IN EACH NOSTRIL TWICE DAILY.  . metFORMIN (GLUCOPHAGE) 500 MG tablet Take 500 mg by mouth 2 (two) times daily.  . NEOMYCIN-POLYMYXIN-HYDROCORTISONE (CORTISPORIN) 1 % SOLN OTIC solution Place 3 drops into both ears 4 (four) times daily.  . pantoprazole (PROTONIX) 40 MG tablet TAKE ONE TABLET BY MOUTH ONCE DAILY  . potassium citrate (UROCIT-K) 10 MEQ (1080 MG) SR tablet Take 2 tablets (20 mEq total) by mouth 3 (three) times daily with meals.  . sildenafil (REVATIO) 20 MG tablet Take 20 mg by mouth as needed.  . terazosin (HYTRIN) 2 MG capsule TAKE ONE CAPSULE BY MOUTH AT BEDTIME   No current facility-administered medications for this visit. (Other)  REVIEW OF SYSTEMS:    ALLERGIES No Known Allergies  PAST MEDICAL HISTORY Past Medical History:  Diagnosis Date  . Anxiety   . Chickenpox   . Depression   . High blood pressure   . High cholesterol   . Nuclear sclerotic cataract of left eye 12/27/2019  . Nuclear sclerotic cataract of right eye 05/10/2020  . Sleep apnea    Past Surgical History:  Procedure Laterality Date  . HERNIA REPAIR     abdominal  1998 2008, inguinal  . TONSILLECTOMY      FAMILY HISTORY Family History  Problem Relation Age of Onset  . Emphysema Mother   . Liver cancer Brother   . Stroke Maternal Uncle    . Stroke Paternal Uncle   . Heart attack Maternal Grandfather   . Colon cancer Son   . Kidney cancer Son     SOCIAL HISTORY Social History   Tobacco Use  . Smoking status: Never Smoker  . Smokeless tobacco: Never Used  Vaping Use  . Vaping Use: Never used  Substance Use Topics  . Alcohol use: Yes    Alcohol/week: 6.0 - 7.0 standard drinks    Types: 6 - 7 Cans of beer per week    Comment: 6-7 per day  . Drug use: No         OPHTHALMIC EXAM: Base Eye Exam    Visual Acuity (ETDRS)      Right Left   Dist [place] 20/20 20/30 +2   Dist ph [place]  20/25 -2       Tonometry (Tonopen, 12:59 PM)      Right Left   Pressure 07 08       Pupils      Dark Light Shape React APD   Right 3 2 Round Brisk None   Left 3.5 2.5 Round Brisk None       Visual Fields (Counting fingers)      Left Right    Full Full       Extraocular Movement      Right Left    Full Full       Neuro/Psych    Oriented x3: Yes   Mood/Affect: Normal       Dilation    Both eyes: 1.0% Mydriacyl, 2.5% Phenylephrine @ 1:02 PM        Slit Lamp and Fundus Exam    External Exam      Right Left   External Normal Normal       Slit Lamp Exam      Right Left   Lids/Lashes Normal Normal   Conjunctiva/Sclera White and quiet White and quiet   Cornea Clear Clear   Anterior Chamber Deep and quiet Deep and quiet   Iris Round and reactive Round and reactive   Lens Centered posterior chamber intraocular lens Centered posterior chamber intraocular lens   Anterior Vitreous Normal Normal       Fundus Exam      Right Left   Posterior Vitreous Normal Posterior vitreous detachment, Central vitreous floaters   Disc Normal Normal   C/D Ratio 0.5 0.3   Macula Early age related macular degeneration, Retinal pigment epithelial atrophy, Hard drusen, no hemorrhage, Retinal pigment epithelial mottling Age related macular degeneration, Early age related macular degeneration,   Inactive Choroidal neovascular membrane,  Retinal pigment epithelial mottling, Soft drusen, Pigmented atrophy   Vessels  Normal   Periphery  Normal          IMAGING  AND PROCEDURES  Imaging and Procedures for 12/21/20  OCT, Retina - OU - Both Eyes       Right Eye Quality was good. Scan locations included subfoveal. Central Foveal Thickness: 399. Progression has been stable. Findings include abnormal foveal contour, no SRF, no IRF, subretinal hyper-reflective material, pigment epithelial detachment, epiretinal membrane.   Left Eye Quality was good. Scan locations included subfoveal. Central Foveal Thickness: 328. Progression has improved. Findings include abnormal foveal contour, no IRF, no SRF, pigment epithelial detachment, cystoid macular edema.   Notes OD continues to be quiescent without signs of CNVM complication with subfoveal old fibrotic PED.  No treatment now for 10 months  OS with chronic active region superotemporally, old rap-like shunt lesion, stable though at 33-month interval repeat injection today       Intravitreal Injection, Pharmacologic Agent - OS - Left Eye       Time Out 12/21/2020. 1:41 PM. Confirmed correct patient, procedure, site, and patient consented.   Anesthesia Topical anesthesia was used. Anesthetic medications included [place] 3.5%.   Procedure Preparation included 10% betadine to eyelids, 5% betadine to ocular surface, Ofloxacin . A 30 gauge needle was used.   Injection:  1.25 mg Bevacizumab (AVASTIN) 1.25mg /0.75mL SOLN   NDC: 25852-778-24, Lot: [id number]   Route: Intravitreal, Site: Left Eye, Waste: 0 mg  Post-op Post injection exam found visual acuity of at least counting fingers. The patient tolerated the procedure well. There were no complications. The patient received written and verbal post procedure care education. Post injection medications were not given.                 ASSESSMENT/PLAN:  Exudative age-related macular degeneration of left eye with active choroidal  neovascularization (HCC) Slightly increased perifoveal CME and thickening at 68-month follow-up.  We will repeat injection Avastin OS today and examination next in 10 weeks  Exudative age-related macular degeneration of right eye with inactive choroidal neovascularization ([place]) OD lesion still quiescent at 10 months post last injection      ICD-10-CM   1. Exudative age-related macular degeneration of left eye with active choroidal neovascularization (HCC)  H35.3221 OCT, Retina - OU - Both Eyes    Intravitreal Injection, Pharmacologic Agent - OS - Left Eye    Bevacizumab (AVASTIN) SOLN 1.25 mg  2. Exudative age-related macular degeneration of right eye with inactive choroidal neovascularization ([place])  H35.3212     1.  OD continues to be quiescent no active no reactivation of CNVM  2.  OS, improved overall yet still slightly active and increase activity at longer duration of 41-month follow-up.  Repeat Avastin today and examination next in 10 weeks  3.  Ophthalmic Meds Ordered this visit:  Meds ordered this encounter  Medications  . Bevacizumab (AVASTIN) SOLN 1.25 mg       Return in about 10 weeks (around 03/01/2021) for DILATE OU, AVASTIN OCT, OS.  There are no Patient Instructions on file for this visit.   Explained the diagnoses, plan, and follow up with the patient and they expressed understanding.  Patient expressed understanding of the importance of proper follow up care.   Clent Demark Scott Fix M.D. Diseases & Surgery of the Retina and Vitreous [facility] Di[facility] [place] 12/21/20     Abbreviations: M myopia (nearsighted); A astigmatism; H hyperopia (farsighted); P presbyopia; Mrx spectacle prescription;  CTL contact lenses; OD right eye; OS left eye; OU both eyes  XT exotropia; ET esotropia; PEK punctate epithelial keratitis; PEE punctate epithelial erosions; DES  dry eye syndrome; MGD meibomian gland dysfunction; ATs artificial tears; PFAT's preservative free artificial tears;  [place] nuclear sclerotic cataract; PSC posterior subcapsular cataract; ERM epi-retinal membrane; PVD posterior vitreous detachment; RD retinal detachment; DM diabetes mellitus; DR diabetic retinopathy; NPDR non-proliferative diabetic retinopathy; PDR proliferative diabetic retinopathy; CSME clinically significant macular edema; DME diabetic macular edema; dbh dot blot hemorrhages; CWS cotton wool spot; POAG primary open angle glaucoma; C/D cup-to-disc ratio; HVF humphrey visual field; GVF goldmann visual field; OCT optical coherence tomography; IOP intraocular pressure; BRVO Branch retinal vein occlusion; CRVO central retinal vein occlusion; CRAO central retinal artery occlusion; BRAO branch retinal artery occlusion; RT retinal tear; SB scleral buckle; PPV pars plana vitrectomy; VH Vitreous hemorrhage; PRP panretinal laser photocoagulation; IVK intravitreal kenalog; VMT vitreomacular traction; MH Macular hole;  NVD neovascularization of the disc; NVE neovascularization elsewhere; AREDS age related eye disease study; ARMD age related macular degeneration; POAG primary open angle glaucoma; EBMD epithelial/anterior basement membrane dystrophy; ACIOL anterior chamber intraocular lens; IOL intraocular lens; PCIOL posterior chamber intraocular lens; Phaco/IOL phacoemulsification with intraocular lens placement; [place] photorefractive keratectomy; LASIK laser assisted in situ keratomileusis; HTN hypertension; DM diabetes mellitus; COPD chronic obstructive pulmonary disease

## 2020-12-21 NOTE — Assessment & Plan Note (Signed)
Slightly increased perifoveal CME and thickening at 38-month follow-up.  We will repeat injection Avastin OS today and examination next in 10 weeks

## 2020-12-25 DIAGNOSIS — M1712 Unilateral primary osteoarthritis, left knee: Secondary | ICD-10-CM | POA: Diagnosis not present

## 2020-12-25 DIAGNOSIS — M25562 Pain in left knee: Secondary | ICD-10-CM | POA: Diagnosis not present

## 2021-01-12 DIAGNOSIS — E785 Hyperlipidemia, unspecified: Secondary | ICD-10-CM | POA: Diagnosis not present

## 2021-01-12 DIAGNOSIS — R972 Elevated prostate specific antigen [PSA]: Secondary | ICD-10-CM | POA: Diagnosis not present

## 2021-01-12 DIAGNOSIS — E559 Vitamin D deficiency, unspecified: Secondary | ICD-10-CM | POA: Diagnosis not present

## 2021-01-12 DIAGNOSIS — Z87898 Personal history of other specified conditions: Secondary | ICD-10-CM | POA: Diagnosis not present

## 2021-01-12 DIAGNOSIS — Z789 Other specified health status: Secondary | ICD-10-CM | POA: Diagnosis not present

## 2021-01-15 DIAGNOSIS — Z23 Encounter for immunization: Secondary | ICD-10-CM | POA: Diagnosis not present

## 2021-01-15 DIAGNOSIS — R4182 Altered mental status, unspecified: Secondary | ICD-10-CM | POA: Diagnosis not present

## 2021-01-15 DIAGNOSIS — J9811 Atelectasis: Secondary | ICD-10-CM | POA: Diagnosis not present

## 2021-01-20 DIAGNOSIS — R404 Transient alteration of awareness: Secondary | ICD-10-CM | POA: Diagnosis not present

## 2021-01-20 DIAGNOSIS — S0183XA Puncture wound without foreign body of other part of head, initial encounter: Secondary | ICD-10-CM | POA: Diagnosis not present

## 2021-01-20 DIAGNOSIS — R58 Hemorrhage, not elsewhere classified: Secondary | ICD-10-CM | POA: Diagnosis not present

## 2021-01-20 DIAGNOSIS — I10 Essential (primary) hypertension: Secondary | ICD-10-CM | POA: Diagnosis not present

## 2021-01-20 DIAGNOSIS — S0180XA Unspecified open wound of other part of head, initial encounter: Secondary | ICD-10-CM | POA: Diagnosis not present

## 2021-01-20 DIAGNOSIS — I499 Cardiac arrhythmia, unspecified: Secondary | ICD-10-CM | POA: Diagnosis not present

## 2021-01-20 DIAGNOSIS — R4020 Unspecified coma: Secondary | ICD-10-CM | POA: Diagnosis not present

## 2021-01-20 DIAGNOSIS — X749XXA Intentional self-harm by unspecified firearm discharge, initial encounter: Secondary | ICD-10-CM | POA: Diagnosis not present

## 2021-01-20 DIAGNOSIS — T07XXXA Unspecified multiple injuries, initial encounter: Secondary | ICD-10-CM | POA: Diagnosis not present

## 2021-01-20 DIAGNOSIS — F32A Depression, unspecified: Secondary | ICD-10-CM | POA: Diagnosis not present

## 2021-01-20 DIAGNOSIS — K219 Gastro-esophageal reflux disease without esophagitis: Secondary | ICD-10-CM | POA: Diagnosis not present

## 2021-01-20 DIAGNOSIS — Z79899 Other long term (current) drug therapy: Secondary | ICD-10-CM | POA: Diagnosis not present

## 2021-01-21 DEATH — deceased

## 2021-02-28 ENCOUNTER — Ambulatory Visit: Payer: Medicare Other | Admitting: Family Medicine

## 2021-03-01 ENCOUNTER — Encounter (INDEPENDENT_AMBULATORY_CARE_PROVIDER_SITE_OTHER): Payer: Self-pay

## 2021-03-01 ENCOUNTER — Encounter (INDEPENDENT_AMBULATORY_CARE_PROVIDER_SITE_OTHER): Payer: Medicare Other | Admitting: Ophthalmology

## 2021-05-22 ENCOUNTER — Other Ambulatory Visit: Payer: Medicare Other

## 2021-05-29 ENCOUNTER — Ambulatory Visit: Payer: Medicare Other | Admitting: Urology

## 2022-03-23 IMAGING — MR MR PROSTATE WO/W CM
12 series · 48 of 48 positions shown · IV contrast (multihance)
Comparison: None.

CLINICAL DATA: Prostate carcinoma.  Active surveillance.

EXAM:
MR PROSTATE WITHOUT AND WITH CONTRAST
TECHNIQUE: Multiplanar multisequence MRI images were obtained of the pelvis
centered about the prostate. Pre and post contrast images were
obtained.
CONTRAST:  20mL MULTIHANCE GADOBENATE DIMEGLUMINE 529 MG/ML IV SOLN

[Series 3: T2 · coronal · 3.0mm · 0.56mm/px · 1 of 23 slices shown (1 of 3)]
[im 1/23]
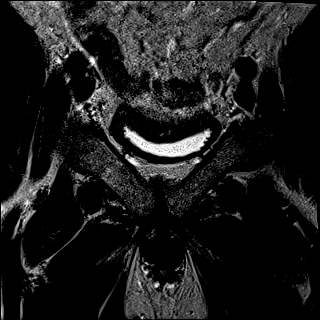

[Series 4: T1 · axial · 5.0mm · 1.25mm/px · z∈[+8,+263]mm · 2 of 104 slices shown]
[im 1/104]
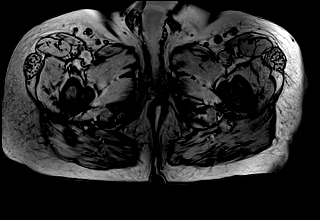
[im 104/104]
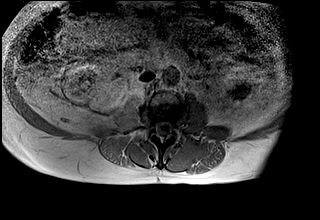

[Series 5: DWI · axial · 3.0mm · 1.75mm/px · z∈[+28,+85]mm · 2 of 60 slices shown (1 of 3)]
[im 1/60]
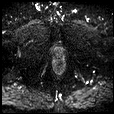
[im 60/60]
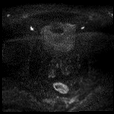

[Series 6: DWI · axial · 3.0mm · 1.75mm/px · 1 of 20 slices shown (2 of 3)]
[im 1/20]
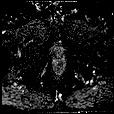

[Series 7: DWI · axial · 3.0mm · 1.75mm/px · 1 of 20 slices shown (3 of 3)]
[im 1/20]
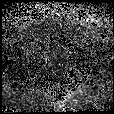

[Series 8: T2 · axial · 3.0mm · 0.56mm/px · 1 of 23 slices shown (2 of 3)]
[im 1/23]
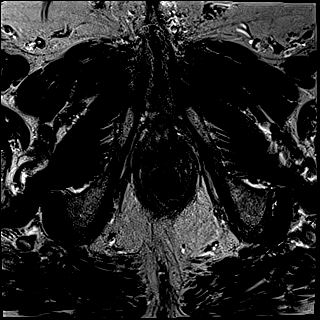

[Series 9: T2 · axial · 1.0mm · 1.04mm/px · z∈[+19,+98]mm · 2 of 80 slices shown (3 of 3)]
[im 1/80]
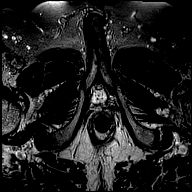
[im 80/80]
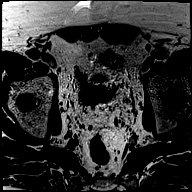

[Series 10: pre t1_twist_tra_dyn · axial · non-contrast · 3.5mm · 0.83mm/px · 1 of 20 slices shown]
[im 1/20]
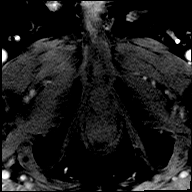

[Series 11: post t1_twist_tra_dyn-copy center · axial · non-contrast · 3.5mm · 0.83mm/px · z∈[+22,+88]mm · 17 of 600 slices shown]
[im 1/600]
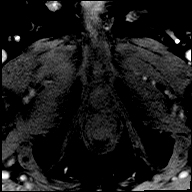
[im 38/600]
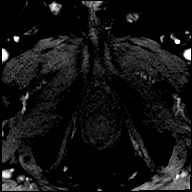
[im 75/600]
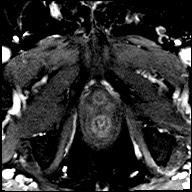
[im 113/600]
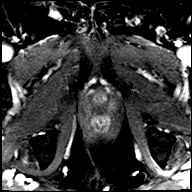
[im 150/600]
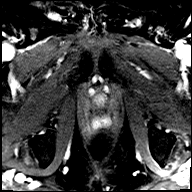
[im 188/600]
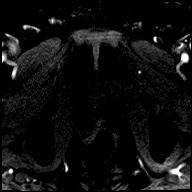
[im 225/600]
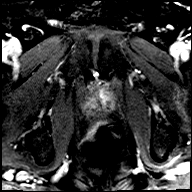
[im 263/600]
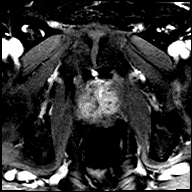
[im 300/600]
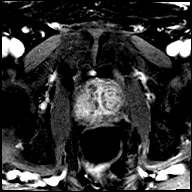
[im 337/600]
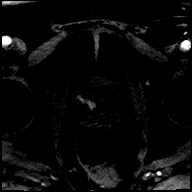
[im 375/600]
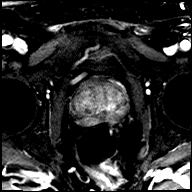
[im 412/600]
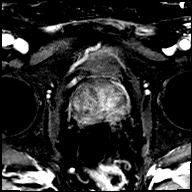
[im 450/600]
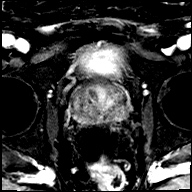
[im 487/600]
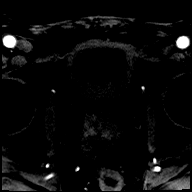
[im 525/600]
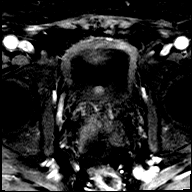
[im 562/600]
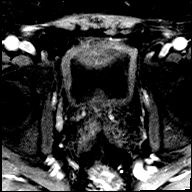
[im 600/600]
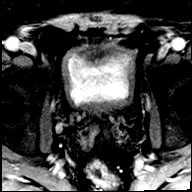

[Series 12: post t1_twist_tra_dyn-copy cent_sub · axial · 3.5mm · 0.83mm/px · z∈[+22,+88]mm · 16 of 564 slices shown]
[im 1/564]
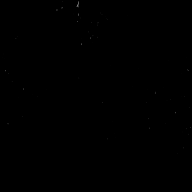
[im 38/564]
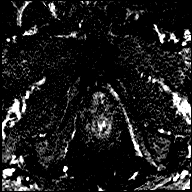
[im 76/564]
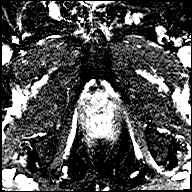
[im 113/564]
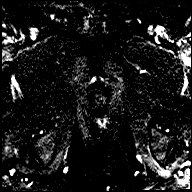
[im 151/564]
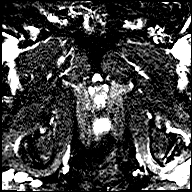
[im 188/564]
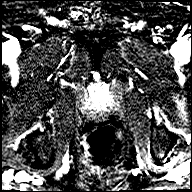
[im 226/564]
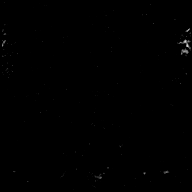
[im 263/564]
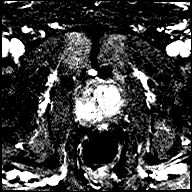
[im 301/564]
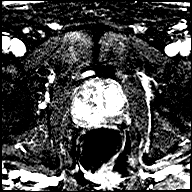
[im 338/564]
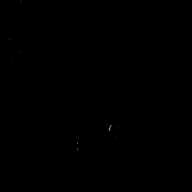
[im 376/564]
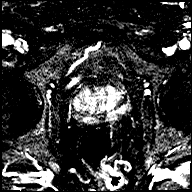
[im 413/564]
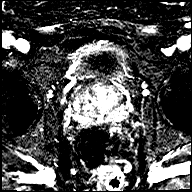
[im 451/564]
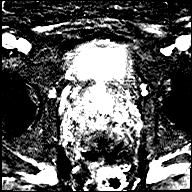
[im 488/564]
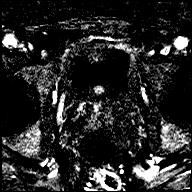
[im 526/564]
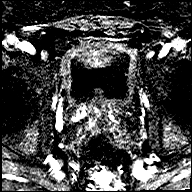
[im 564/564]
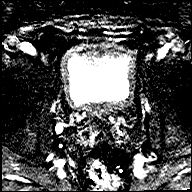

[Series 13: t1_vibe_dixon_tra_f · axial · 2.5mm · 0.91mm/px · z∈[+37,+234]mm · 2 of 80 slices shown]
[im 1/80]
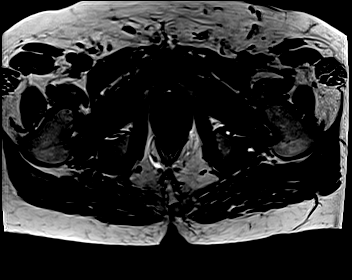
[im 80/80]
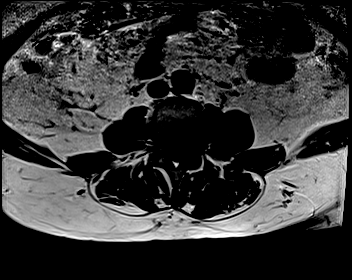

[Series 14: t1_vibe_dixon_tra_w · axial · 2.5mm · 0.91mm/px · z∈[+37,+234]mm · 2 of 80 slices shown]
[im 1/80]
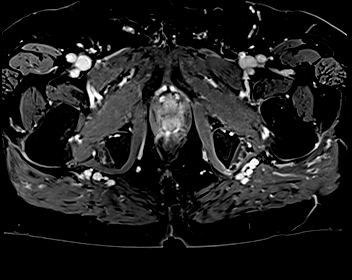
[im 80/80]
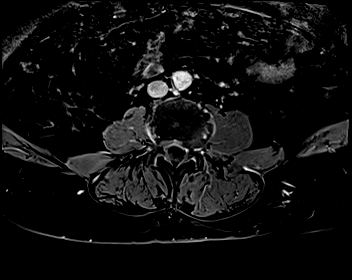

[48 of 48 positions shown; findings below may reference images not displayed]

FINDINGS: Prostate:

-- Peripheral Zone: No abnormality seen on ADC and high b-value DWI
sequences.

-- Transition/Central Zone: Circumscribed BPH nodules are noted, but
no suspicious nodules with obscured or non-circumscribed margins
seen.

-- Measurements/Volume:  3.9 x 3.7 x 4.9 cm (volume = 37 cm^3)

Transcapsular spread:  Absent

Seminal vesicle involvement:  Absent

Neurovascular bundle involvement:  Absent

Pelvic adenopathy: None visualized

Bone metastasis: None visualized

Other:  None
IMPRESSION: No radiographic evidence of high-grade prostate carcinoma. PI-RADS
1: Very Low (clinically significant cancer is highly unlikely to be
present)
